# Patient Record
Sex: Male | Born: 1970 | Race: Black or African American | Hispanic: No | Marital: Married | Smoking: Former smoker
Health system: Southern US, Community
[De-identification: ages and names within clinical notes are randomized; demographics above are authoritative.]

## PROBLEM LIST (undated history)

## (undated) DIAGNOSIS — I10 Essential (primary) hypertension: Secondary | ICD-10-CM

## (undated) DIAGNOSIS — G71039 Limb girdle muscular dystrophy, unspecified: Secondary | ICD-10-CM

## (undated) DIAGNOSIS — M6282 Rhabdomyolysis: Secondary | ICD-10-CM

## (undated) DIAGNOSIS — Z87438 Personal history of other diseases of male genital organs: Secondary | ICD-10-CM

## (undated) DIAGNOSIS — G7109 Other specified muscular dystrophies: Secondary | ICD-10-CM

## (undated) HISTORY — DX: Limb girdle muscular dystrophy, unspecified: G71.039

## (undated) HISTORY — DX: Personal history of other diseases of male genital organs: Z87.438

## (undated) HISTORY — PX: MUSCLE BIOPSY: SHX716

## (undated) HISTORY — DX: Rhabdomyolysis: M62.82

## (undated) HISTORY — PX: HIP SURGERY: SHX245

## (undated) HISTORY — DX: Other specified muscular dystrophies: G71.09

---

## 2004-05-22 ENCOUNTER — Inpatient Hospital Stay (HOSPITAL_COMMUNITY): Admission: EM | Admit: 2004-05-22 | Discharge: 2004-05-23 | Payer: Self-pay | Admitting: *Deleted

## 2005-02-17 ENCOUNTER — Observation Stay (HOSPITAL_COMMUNITY): Admission: EM | Admit: 2005-02-17 | Discharge: 2005-02-19 | Payer: Self-pay | Admitting: Emergency Medicine

## 2005-08-21 ENCOUNTER — Encounter: Payer: Self-pay | Admitting: Internal Medicine

## 2006-02-16 ENCOUNTER — Ambulatory Visit: Payer: Self-pay | Admitting: Family Medicine

## 2006-03-07 ENCOUNTER — Encounter: Payer: Self-pay | Admitting: Internal Medicine

## 2006-06-03 ENCOUNTER — Emergency Department (HOSPITAL_COMMUNITY): Admission: EM | Admit: 2006-06-03 | Discharge: 2006-06-03 | Payer: Self-pay | Admitting: *Deleted

## 2006-08-22 ENCOUNTER — Encounter: Payer: Self-pay | Admitting: Internal Medicine

## 2006-11-26 ENCOUNTER — Encounter (INDEPENDENT_AMBULATORY_CARE_PROVIDER_SITE_OTHER): Payer: Self-pay | Admitting: Family Medicine

## 2007-02-22 ENCOUNTER — Ambulatory Visit: Payer: Self-pay | Admitting: Family Medicine

## 2007-02-22 DIAGNOSIS — G7109 Other specified muscular dystrophies: Secondary | ICD-10-CM

## 2007-02-25 ENCOUNTER — Telehealth (INDEPENDENT_AMBULATORY_CARE_PROVIDER_SITE_OTHER): Payer: Self-pay | Admitting: *Deleted

## 2007-02-25 ENCOUNTER — Encounter (INDEPENDENT_AMBULATORY_CARE_PROVIDER_SITE_OTHER): Payer: Self-pay | Admitting: *Deleted

## 2007-02-25 LAB — CONVERTED CEMR LAB
ALT: 25 units/L (ref 0–53)
AST: 24 units/L (ref 0–37)
Albumin: 3.8 g/dL (ref 3.5–5.2)
Alkaline Phosphatase: 69 units/L (ref 39–117)
BUN: 9 mg/dL (ref 6–23)
Bilirubin, Direct: 0.1 mg/dL (ref 0.0–0.3)
CO2: 29 meq/L (ref 19–32)
Calcium: 9.2 mg/dL (ref 8.4–10.5)
Chloride: 105 meq/L (ref 96–112)
Cholesterol: 169 mg/dL (ref 0–200)
Creatinine, Ser: 1.2 mg/dL (ref 0.4–1.5)
GFR calc Af Amer: 87 mL/min
GFR calc non Af Amer: 72 mL/min
Glucose, Bld: 89 mg/dL (ref 70–99)
HDL: 26.5 mg/dL — ABNORMAL LOW (ref >39.0)
LDL Cholesterol: 125 mg/dL — ABNORMAL HIGH (ref 0–99)
Magnesium: 1.9 mg/dL (ref 1.5–2.5)
Phosphorus: 3.9 mg/dL (ref 2.3–4.6)
Potassium: 3.7 meq/L (ref 3.5–5.1)
Sodium: 140 meq/L (ref 135–145)
TSH: 0.85 microintl units/mL (ref 0.35–5.50)
Total Bilirubin: 1.4 mg/dL — ABNORMAL HIGH (ref 0.3–1.2)
Total CHOL/HDL Ratio: 6.4
Total Protein: 6.8 g/dL (ref 6.0–8.3)
Triglycerides: 88 mg/dL (ref 0–149)
VLDL: 18 mg/dL (ref 0–40)

## 2007-04-29 ENCOUNTER — Telehealth (INDEPENDENT_AMBULATORY_CARE_PROVIDER_SITE_OTHER): Payer: Self-pay | Admitting: *Deleted

## 2007-05-17 ENCOUNTER — Encounter (INDEPENDENT_AMBULATORY_CARE_PROVIDER_SITE_OTHER): Payer: Self-pay | Admitting: Family Medicine

## 2007-05-24 ENCOUNTER — Ambulatory Visit: Payer: Self-pay | Admitting: Family Medicine

## 2007-05-24 DIAGNOSIS — R945 Abnormal results of liver function studies: Secondary | ICD-10-CM | POA: Insufficient documentation

## 2007-05-28 ENCOUNTER — Encounter (INDEPENDENT_AMBULATORY_CARE_PROVIDER_SITE_OTHER): Payer: Self-pay | Admitting: *Deleted

## 2007-05-28 LAB — CONVERTED CEMR LAB
Basophils Absolute: 0 10*3/uL (ref 0.0–0.1)
Basophils Relative: 0 % (ref 0–1)
Bilirubin, Direct: 0.1 mg/dL (ref 0.0–0.3)
Eosinophils Absolute: 0.1 10*3/uL (ref 0.0–0.7)
Eosinophils Relative: 2 % (ref 0–5)
HCT: 44 % (ref 39.0–52.0)
Hemoglobin: 14.3 g/dL (ref 13.0–17.0)
Lymphocytes Relative: 46 % (ref 12–46)
Lymphs Abs: 2.1 10*3/uL (ref 0.7–4.0)
MCHC: 32.5 g/dL (ref 30.0–36.0)
MCV: 90.5 fL (ref 78.0–100.0)
Monocytes Absolute: 0.3 10*3/uL (ref 0.1–1.0)
Monocytes Relative: 6 % (ref 3–12)
Neutro Abs: 2.2 10*3/uL (ref 1.7–7.7)
Neutrophils Relative %: 46 % (ref 43–77)
Platelets: 246 10*3/uL (ref 150–400)
RBC: 4.86 M/uL (ref 4.22–5.81)
RDW: 13.3 % (ref 11.5–15.5)
Total Bilirubin: 0.9 mg/dL (ref 0.3–1.2)
WBC: 4.7 10*3/uL (ref 4.0–10.5)

## 2007-10-09 ENCOUNTER — Ambulatory Visit: Payer: Self-pay | Admitting: Internal Medicine

## 2007-10-09 DIAGNOSIS — N500 Atrophy of testis: Secondary | ICD-10-CM

## 2007-10-09 DIAGNOSIS — B079 Viral wart, unspecified: Secondary | ICD-10-CM | POA: Insufficient documentation

## 2007-10-10 ENCOUNTER — Telehealth: Payer: Self-pay | Admitting: Internal Medicine

## 2007-10-15 ENCOUNTER — Ambulatory Visit: Payer: Self-pay | Admitting: Internal Medicine

## 2007-10-15 DIAGNOSIS — E785 Hyperlipidemia, unspecified: Secondary | ICD-10-CM

## 2007-10-15 DIAGNOSIS — L259 Unspecified contact dermatitis, unspecified cause: Secondary | ICD-10-CM | POA: Insufficient documentation

## 2008-07-27 ENCOUNTER — Encounter: Payer: Self-pay | Admitting: Internal Medicine

## 2008-08-03 ENCOUNTER — Ambulatory Visit: Payer: Self-pay | Admitting: Diagnostic Radiology

## 2008-08-03 ENCOUNTER — Ambulatory Visit (HOSPITAL_BASED_OUTPATIENT_CLINIC_OR_DEPARTMENT_OTHER): Admission: RE | Admit: 2008-08-03 | Discharge: 2008-08-03 | Payer: Self-pay | Admitting: Internal Medicine

## 2008-08-03 ENCOUNTER — Telehealth: Payer: Self-pay | Admitting: Internal Medicine

## 2008-08-03 ENCOUNTER — Ambulatory Visit: Payer: Self-pay | Admitting: Internal Medicine

## 2008-08-03 DIAGNOSIS — M6282 Rhabdomyolysis: Secondary | ICD-10-CM

## 2008-08-03 DIAGNOSIS — M25559 Pain in unspecified hip: Secondary | ICD-10-CM

## 2008-08-03 LAB — CONVERTED CEMR LAB
Hgb A2 Quant: 2.8 % (ref 2.2–3.2)
Hgb A: 97.2 % (ref 96.8–97.8)
Hgb F Quant: 0 % (ref 0.0–2.0)
Hgb S Quant: 0 % (ref 0.0–0.0)

## 2008-08-06 ENCOUNTER — Telehealth: Payer: Self-pay | Admitting: Internal Medicine

## 2008-08-25 ENCOUNTER — Telehealth: Payer: Self-pay | Admitting: Internal Medicine

## 2008-09-07 ENCOUNTER — Encounter: Payer: Self-pay | Admitting: Internal Medicine

## 2008-12-22 ENCOUNTER — Ambulatory Visit: Payer: Self-pay | Admitting: Internal Medicine

## 2008-12-22 DIAGNOSIS — R61 Generalized hyperhidrosis: Secondary | ICD-10-CM | POA: Insufficient documentation

## 2008-12-22 DIAGNOSIS — R51 Headache: Secondary | ICD-10-CM

## 2008-12-22 DIAGNOSIS — R519 Headache, unspecified: Secondary | ICD-10-CM | POA: Insufficient documentation

## 2008-12-22 LAB — CONVERTED CEMR LAB
ALT: 38 units/L (ref 0–53)
AST: 27 units/L (ref 0–37)
Albumin: 4.3 g/dL (ref 3.5–5.2)
Alkaline Phosphatase: 82 units/L (ref 39–117)
BUN: 12 mg/dL (ref 6–23)
Bilirubin, Direct: 0.2 mg/dL (ref 0.0–0.3)
CO2: 25 meq/L (ref 19–32)
Calcium: 9.5 mg/dL (ref 8.4–10.5)
Chloride: 106 meq/L (ref 96–112)
Creatinine, Ser: 1.21 mg/dL (ref 0.40–1.50)
Free T4: 0.81 ng/dL (ref 0.80–1.80)
Glucose, Bld: 93 mg/dL (ref 70–99)
Indirect Bilirubin: 1 mg/dL — ABNORMAL HIGH (ref 0.0–0.9)
Potassium: 4.3 meq/L (ref 3.5–5.3)
Sodium: 143 meq/L (ref 135–145)
TSH: 0.982 microintl units/mL (ref 0.350–4.500)
Total Bilirubin: 1.2 mg/dL (ref 0.3–1.2)
Total CK: 503 units/L — ABNORMAL HIGH (ref 7–232)
Total Protein: 7.1 g/dL (ref 6.0–8.3)

## 2008-12-23 ENCOUNTER — Encounter: Payer: Self-pay | Admitting: Internal Medicine

## 2009-01-26 ENCOUNTER — Ambulatory Visit (HOSPITAL_BASED_OUTPATIENT_CLINIC_OR_DEPARTMENT_OTHER): Admission: RE | Admit: 2009-01-26 | Discharge: 2009-01-26 | Payer: Self-pay | Admitting: Internal Medicine

## 2009-01-26 ENCOUNTER — Encounter: Payer: Self-pay | Admitting: Internal Medicine

## 2009-02-08 ENCOUNTER — Ambulatory Visit: Payer: Self-pay | Admitting: Pulmonary Disease

## 2009-09-16 ENCOUNTER — Ambulatory Visit: Payer: Self-pay | Admitting: Internal Medicine

## 2009-09-16 LAB — CONVERTED CEMR LAB
ALT: 43 units/L (ref 0–53)
AST: 24 units/L (ref 0–37)
Albumin: 4.2 g/dL (ref 3.5–5.2)
Alkaline Phosphatase: 90 units/L (ref 39–117)
BUN: 13 mg/dL (ref 6–23)
Bilirubin, Direct: 0.1 mg/dL (ref 0.0–0.3)
CO2: 24 meq/L (ref 19–32)
Calcium: 9.6 mg/dL (ref 8.4–10.5)
Chloride: 107 meq/L (ref 96–112)
Cholesterol: 187 mg/dL (ref 0–200)
Creatinine, Ser: 1.26 mg/dL (ref 0.40–1.50)
Glucose, Bld: 93 mg/dL (ref 70–99)
HDL: 36 mg/dL — ABNORMAL LOW (ref >39)
Indirect Bilirubin: 0.6 mg/dL (ref 0.0–0.9)
LDL Cholesterol: 130 mg/dL — ABNORMAL HIGH (ref 0–99)
Potassium: 4.6 meq/L (ref 3.5–5.3)
Sodium: 145 meq/L (ref 135–145)
TSH: 1.313 microintl units/mL (ref 0.350–4.500)
Total Bilirubin: 0.7 mg/dL (ref 0.3–1.2)
Total CHOL/HDL Ratio: 5.2
Total CK: 410 units/L — ABNORMAL HIGH (ref 7–232)
Total Protein: 7 g/dL (ref 6.0–8.3)
Triglycerides: 103 mg/dL (ref <150)
VLDL: 21 mg/dL (ref 0–40)

## 2009-09-17 ENCOUNTER — Encounter: Payer: Self-pay | Admitting: Internal Medicine

## 2010-04-17 LAB — CONVERTED CEMR LAB
ALT: 22 units/L (ref 0–53)
AST: 21 units/L (ref 0–37)
Albumin: 4.1 g/dL (ref 3.5–5.2)
Alkaline Phosphatase: 78 units/L (ref 39–117)
BUN: 13 mg/dL (ref 6–23)
Bacteria, UA: NEGATIVE
Bilirubin Urine: NEGATIVE
Bilirubin, Direct: 0.1 mg/dL (ref 0.0–0.3)
CO2: 30 meq/L (ref 19–32)
Calcium: 9.5 mg/dL (ref 8.4–10.5)
Chloride: 108 meq/L (ref 96–112)
Cholesterol: 178 mg/dL (ref 0–200)
Creatinine, Ser: 1.3 mg/dL (ref 0.4–1.5)
Crystals: NEGATIVE
GFR calc Af Amer: 79 mL/min
GFR calc non Af Amer: 65 mL/min
Glucose, Bld: 90 mg/dL (ref 70–99)
HCT: 41.6 % (ref 39.0–52.0)
HDL: 27.8 mg/dL — ABNORMAL LOW (ref >39.0)
Hemoglobin, Urine: NEGATIVE
Hemoglobin: 14.4 g/dL (ref 13.0–17.0)
Ketones, ur: NEGATIVE mg/dL
LDL Cholesterol: 131 mg/dL — ABNORMAL HIGH (ref 0–99)
Leukocytes, UA: NEGATIVE
MCHC: 34.5 g/dL (ref 30.0–36.0)
MCV: 91.6 fL (ref 78.0–100.0)
Nitrite: NEGATIVE
PSA: 0.3 ng/mL (ref 0.10–4.00)
Platelets: 248 10*3/uL (ref 150–400)
Potassium: 4.9 meq/L (ref 3.5–5.1)
RBC / HPF: NONE SEEN
RBC: 4.54 M/uL (ref 4.22–5.81)
RDW: 12.5 % (ref 11.5–14.6)
Sodium: 144 meq/L (ref 135–145)
Specific Gravity, Urine: 1.025 (ref 1.000–1.03)
Squamous Epithelial / HPF: NEGATIVE /lpf
TSH: 0.9 microintl units/mL (ref 0.35–5.50)
Total Bilirubin: 1.2 mg/dL (ref 0.3–1.2)
Total CHOL/HDL Ratio: 6.4
Total Protein, Urine: NEGATIVE mg/dL
Total Protein: 7.4 g/dL (ref 6.0–8.3)
Triglycerides: 96 mg/dL (ref 0–149)
Urine Glucose: NEGATIVE mg/dL
Urobilinogen, UA: 0.2 (ref 0.0–1.0)
VLDL: 19 mg/dL (ref 0–40)
WBC, UA: NONE SEEN cells/hpf
WBC: 4.9 10*3/uL (ref 4.5–10.5)
pH: 6 (ref 5.0–8.0)
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
## 2010-04-19 NOTE — Letter (Signed)
   [place] [facility] [address] [place], [place]  [address]  [place] Phone: [phone]      September 17, 2009   MONTRAIL MEHRER [address] [place], [place] [address]  RE:  LAB RESULTS  Dear  Randy James,  The following is an interpretation of your most recent lab tests.  Please take note of any instructions provided or changes to medications that have resulted from your lab work.  ELECTROLYTES:  Good - no changes needed  KIDNEY FUNCTION TESTS:  Good - no changes needed  LIVER FUNCTION TESTS:  Good - no changes needed  LIPID PANEL:  Fair - review at your next visit Triglyceride: 103   Cholesterol: 187   LDL: 130   HDL: 36   Chol/HDL%:  5.2 Ratio  THYROID STUDIES:  Thyroid studies normal TSH: 1.313     CPK - 410 ( mild elevation )       Sincerely Yours,    Dr. Thomos Lemons

## 2010-08-05 NOTE — H&P (Signed)
NAMEBOHDI, LEEDS             ACCOUNT NO.:  [id number]   MEDICAL RECORD NO.:  [id number]          PATIENT TYPE:  INP   LOCATION:  3735                         FACILITY:  [facility]   PHYSICIAN:  Kela Millin, M.D.DATE OF BIRTH:  September 30, 1970   DATE OF ADMISSION:  02/17/2005  DATE OF DISCHARGE:                                HISTORY & PHYSICAL   PRIMARY CARE PHYSICIAN:  Leanne Chang, M.D.   CHIEF COMPLAINT:  Chest tightness.   HISTORY OF PRESENT ILLNESS:  The patient is a 40 year old black male with  past medical history significant for rhabdomyolysis, February 2006, who  presents with complaints of chest tightness for one day.  He states that it  was constant and 7/10 in intensity and substernal in location.  He  admits  to __________  shortness of breath and denies nausea and vomiting.  Also  denies radiation.  The patient denies cough, orthopnea, and PND and also no  leg swelling.  The patient was seen in the ER and a point-of-care markers  done revealed CK of 12,108, with troponins of less than 0.05.  The patient  denies myalgias, hematuria. Also denies dysuria and no fevers.  He states  that after hospitalization in February 2006 for rhabdo, he did not go back  to exercising until about six days ago when he went and did light weight  lifting and played basketball as well.  He denies cocaine use.  Also denies  amphetamine abuse.  In the ER he also had a urine drug screen done which was  positive for amphetamines and he states that he took some Dayquil.  He is  admitted to the [facility] for evaluation and management.   PAST MEDICAL HISTORY:  1.  As above.  2.  History of epididymidis/abscess.   MEDICATIONS:  Dayquil p.r.n.   ALLERGIES:  No known drug allergies.   SOCIAL HISTORY:  He denies tobacco.  Also denies alcohol.  Denies illicit  drug use.   REVIEW OF SYSTEMS:  Per HPI.  Other complaints on review of systems  negative.   PHYSICAL EXAMINATION:   GENERAL:  The patient is a young, black male in no  apparent distress.  VITAL SIGNS:  Temperature 98, blood pressure 135/80, pulse 67, respiratory  rate 18, oxygen saturation 97%.  HEENT:  PERRL.  Sclerae anicteric.  EOMI.  No oral exudates.  LUNGS:  Clear to auscultation bilaterally.  No crackles or wheezes.  CARDIOVASCULAR:  Regular rate and rhythm.  Normal S1 and S2.  ABDOMEN:  Soft.  Bowel sounds present.  Nontender.  Nondistended.  No  organomegaly and no masses palpable.  EXTREMITIES:  No cyanosis or edema.  NEUROLOGIC:  Alert and oriented x3.  Cranial nerves II-XII grossly intact.  Nonfocal exam.   LABORATORY DATA:  Urinalysis shows specific gravity of 1.030.  Urine PH of  6.5.  Urine glucose negative.  Urine bilirubin negative.  Urine ketones  negative.  Leukocyte esterase negative.  White cell count is 4.8, hemoglobin  13.2, hematocrit 38.4, platelet count 221, neutrophil count 55%.  Sodium  141, potassium 3.9, chloride 106,  CO2 31, glucose 89, BUN 7, creatinine 1.2,  calcium 8.8, myoglobin greater than 500.  CK-MB 2.6.  Troponin-I less than  0.05.  His total CK is 12,108.  Urine drug screen positive for amphetamines.   ASSESSMENT/PLAN:  Problem #1.  Abnormal CK/rhabdomyolysis.  As discussed  above, the patient is a 40 year old with previous history of rhabdomyolysis  presenting with chest tightness and CKs found to be markedly elevated as  above with normal troponins.  Urine is positive for amphetamines although he  denies amphetamine abuse and he admits to weight lifting and playing  basketball about six days ago but states he did not overexert himself.  Will  start IV fluids, saline diuresis, monitor kidney function and recheck CK.  Follow and consider urine colonization as appropriate pending repeat labs.  Will also check a serum phosphate level, follow and consider further  evaluation as appropriate.           ______________________________  Kela Millin,  M.D.     ACV/MEDQ  D:  02/16/2005  T:  02/17/2005  Job:  [id number]   cc:   Leanne Chang, M.D.  Fax: [phone]

## 2010-08-05 NOTE — H&P (Signed)
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  On Wednesday, he did a very vigorous weight  lifting work out including three sets of lifts on his back with the head up,  three sets with the head down, and then two additional sets.  After  completing this work out, his chest, shoulder muscles, and biceps muscles  were extremely sore.  The area around the elbows were somewhat swollen.  The  next day, he noted on two occasions that his urine was dark.  Today, he  continued to note the dark urine and pointed this out to his wife who urged  him to go to urgent care.  He went to the [facility] where his  blood pressure was noted to be 110/80.  Apparently a urinalysis was done in  the [facility] which showed the presence of substantial blood and  protein as well as granular casts, for this reason he was urged to come to  the emergency room with the presumptive diagnosis of rhabdomyolysis.  Since  his arrival in the emergency room, he has been found to have a sodium of  141, potassium of 4.2, a BUN of 14, creatinine of 1.4.  His serum  bicarbonate is 28.2.  CPK is known to be elevated although the actual result  is not known at this point as it has to be diluted.  The patient is admitted  at this time for further treatment of rhabdomyolysis.   PAST MEDICAL HISTORY:   MEDICATIONS:  None.   ALLERGIES:  None.   MEDICAL ILLNESSES:  1.  In 1998, the patient was treated for epididymidis and actually had  to      have a abscess incised and drained at that time.  2.  He has had a previous tonsillar abscess which was drained.  He does not recall any other significant illnesses.   OPERATIONS:  Otherwise none.   FAMILY HISTORY:  He has a brother with diabetes.  His mother has mild  hypertension.  His father also has diabetes.   SOCIAL HISTORY:  The patient indicates he does not smoke.  He does not abuse  alcohol or drugs.   REVIEW OF SYSTEMS:  HEAD:  He denies headache or dizziness.  EYES:  He  denies visual blurring or diplopia.  EARS/NOSE/THROAT:  He denies earache,  sinus pain, or sore throat.  CHEST:  Denies coughing, wheezing, or chest  congestion.  CARDIOVASCULAR:  Denies orthopnea, PND, or ankle edema.  GI:  Denies nausea, vomiting, abdominal pain, change in bowel  habits, melena, or  hematochezia.  GU:  See above.  There has been no dysuria.  No urinary  frequency.  There is no previous history of urinary tract infection, except  as noted above.  NEURO:  There is no history of seizure or stroke.  ENDO:  There is no history of excessive thirst, urinary frequency, nocturia, heat  or cold intolerance.   PHYSICAL EXAMINATION:  VITAL SIGNS:  His blood pressure was 137/85, pulse  68, respirations 20, O2 saturation was 97%.  HEENT:  Within normal limits.  CHEST:  Clear.  BACK:  Revealed no CVA or point tenderness.  CARDIOVASCULAR:  Revealed normal S1 and S2.  There were no rubs, murmurs, or  gallops.  ABDOMEN:  Benign.  There are normal bowel sounds.  There are no masses or  tenderness.  No guarding or rebound.  NEUROLOGIC:  Testing.  EXTREMITIES:  Normal except that the muscle tissue around the elbows was  somewhat tender to palpation and the chest wall was somewhat tender to  palpation as well.   IMPRESSION:  1.  Rhabdomyolysis.  2.  History of epididymidis.   PLAN:  1.  The patient will be admitted for intravenous fluid therapy, to try to      achieve significant diuresis.   2.  We will administer normal saline x 2 liters and then add bicarbonate to      the IV fluids to attempt to alkalinize the urine.  Please see orders.  3.  The renal function will be followed closely as will the serum CPK.  4.  A renal ultrasound will be obtained to assess kidney morphology.      SY/MEDQ  D:  05/21/2004  T:  05/22/2004  Job:  [id number]

## 2010-08-05 NOTE — Assessment & Plan Note (Signed)
[facility]                                 ON-CALL NOTE   PHENG, PROKOP                      MRN:          [id number]  DATE:06/03/2006                            DOB:          18-Nov-1970    Mr. 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 He has a history of muscular dystrophy  precipitated significantly with physical activity. The patient reports  that he had worked in the yard yesterday and today woke up with pain in  his joints with swelling, especially in his elbows.   PLAN:  I advised the patient that he should seek medical attention  immediately either in the emergency department or at an urgent care. I  also advised the patient that he needs to establish physically in our  office in the near future. The patient expressed understanding.     Leanne Chang, M.D.  Electronically Signed    LA/MedQ  DD: 06/03/2006  DT: 06/04/2006  Job #: [id number]

## 2010-10-18 ENCOUNTER — Encounter: Payer: Self-pay | Admitting: Internal Medicine

## 2010-10-21 ENCOUNTER — Encounter: Payer: Self-pay | Admitting: Internal Medicine

## 2010-10-24 ENCOUNTER — Ambulatory Visit (INDEPENDENT_AMBULATORY_CARE_PROVIDER_SITE_OTHER): Payer: BC Managed Care – PPO | Admitting: Internal Medicine

## 2010-10-24 ENCOUNTER — Encounter: Payer: Self-pay | Admitting: Internal Medicine

## 2010-10-24 VITALS — BP 126/90 | HR 66 | Temp 98.0°F | Resp 18 | Ht 69.5 in | Wt 248.0 lb

## 2010-10-24 DIAGNOSIS — Z0000 Encounter for general adult medical examination without abnormal findings: Secondary | ICD-10-CM | POA: Insufficient documentation

## 2010-10-24 LAB — BASIC METABOLIC PANEL
BUN: 11 mg/dL (ref 6–23)
CO2: 29 mEq/L (ref 19–32)
Glucose, Bld: 87 mg/dL (ref 70–99)
Potassium: 4.1 mEq/L (ref 3.5–5.3)
Sodium: 142 mEq/L (ref 135–145)

## 2010-10-24 LAB — LIPID PANEL
Cholesterol: 159 mg/dL (ref 0–200)
HDL: 28 mg/dL — ABNORMAL LOW (ref >39)
Total CHOL/HDL Ratio: 5.7 Ratio
Triglycerides: 168 mg/dL — ABNORMAL HIGH (ref <150)
VLDL: 34 mg/dL (ref 0–40)

## 2010-10-24 LAB — CBC WITH DIFFERENTIAL/PLATELET
Basophils Absolute: 0 10*3/uL (ref 0.0–0.1)
Basophils Relative: 0 % (ref 0–1)
Eosinophils Absolute: 0.2 10*3/uL (ref 0.0–0.7)
Eosinophils Relative: 3 % (ref 0–5)
HCT: 40.9 % (ref 39.0–52.0)
Hemoglobin: 13.9 g/dL (ref 13.0–17.0)
Lymphs Abs: 2.4 10*3/uL (ref 0.7–4.0)
MCH: 30.5 pg (ref 26.0–34.0)
MCHC: 34 g/dL (ref 30.0–36.0)
MCV: 89.7 fL (ref 78.0–100.0)
Monocytes Relative: 8 % (ref 3–12)
Neutro Abs: 2.4 10*3/uL (ref 1.7–7.7)
Neutrophils Relative %: 45 % (ref 43–77)
Platelets: 243 10*3/uL (ref 150–400)
RBC: 4.56 MIL/uL (ref 4.22–5.81)
RDW: 13.1 % (ref 11.5–15.5)
WBC: 5.4 10*3/uL (ref 4.0–10.5)

## 2010-10-24 LAB — T4, FREE: Free T4: 0.87 ng/dL (ref 0.80–1.80)

## 2010-10-24 LAB — HEPATIC FUNCTION PANEL
ALT: 20 U/L (ref 0–53)
AST: 21 U/L (ref 0–37)
Alkaline Phosphatase: 88 U/L (ref 39–117)
Bilirubin, Direct: 0.1 mg/dL (ref 0.0–0.3)
Indirect Bilirubin: 0.5 mg/dL (ref 0.0–0.9)
Total Bilirubin: 0.6 mg/dL (ref 0.3–1.2)
Total Protein: 6.9 g/dL (ref 6.0–8.3)

## 2010-10-24 LAB — VITAMIN B12: Vitamin B-12: 269 pg/mL (ref 211–911)

## 2010-10-24 LAB — TSH: TSH: 1.098 u[IU]/mL (ref 0.350–4.500)

## 2010-10-24 MED ORDER — FLUTICASONE PROPIONATE 50 MCG/ACT NA SUSP: 1.0000 | Freq: Every day | NASAL | Status: DC

## 2010-10-24 NOTE — Assessment & Plan Note (Signed)
Nl exam. Obtain cbc, chem7, lipid, lft, tsh, ua, vit d, b12 and testosterone. Attempt flonase due to hoarseness and drainage/congestion. followup if no improvement or worsening

## 2010-10-24 NOTE — Progress Notes (Signed)
  Subjective:    Patient ID: Randy James, male    DOB: Oct 29, 1970, 40 y.o.   MRN: [id number]  HPI Pt presents to clinic for annual physical. H/o of muscular dystrophy followed at [facility]. Notes mild hoarseness since 7/5. No recent cough or uri sx's. bp minimally elevated. Complaints of generalized fatigue. No other complaints.  Reviewed pmh, medications and allergies.    Review of Systems see hpi     Objective:   Physical Exam  Nursing note and vitals reviewed. Constitutional: He appears well-developed and well-nourished. No distress.  HENT:  Head: Normocephalic and atraumatic.  Right Ear: Tympanic membrane, external ear and ear canal normal.  Left Ear: Tympanic membrane, external ear and ear canal normal.  Nose: Nose normal.  Mouth/Throat: Oropharynx is clear and moist. No oropharyngeal exudate.       Posterior op clear drainage.  Eyes: EOM are normal. Pupils are equal, round, and reactive to light. Right eye exhibits no discharge. Left eye exhibits no discharge. No scleral icterus.  Neck: Neck supple. No JVD present. Carotid bruit is not present. No thyromegaly present.  Cardiovascular: Normal rate, regular rhythm, normal heart sounds and intact distal pulses.  Exam reveals no gallop and no friction rub.   No murmur heard. Pulmonary/Chest: Effort normal and breath sounds normal. No respiratory distress. He has no wheezes. He has no rales.  Abdominal: Soft. Bowel sounds are normal. He exhibits no distension and no mass. There is no hepatosplenomegaly. There is no tenderness. There is no rebound and no guarding. Hernia confirmed negative in the right inguinal area and confirmed negative in the left inguinal area.  Genitourinary: Testes normal. Right testis shows no mass and no tenderness. Right testis is descended. Left testis shows no mass and no tenderness. Left testis is descended.  Lymphadenopathy:    He has no cervical adenopathy.       Right: No inguinal adenopathy present.         Left: No inguinal adenopathy present.  Neurological: He is alert.  Skin: Skin is warm and dry. No rash noted. He is not diaphoretic. No erythema.  Psychiatric: He has a normal mood and affect.          Assessment & Plan:

## 2010-10-24 NOTE — Patient Instructions (Signed)
Please schedule morning lab testosterone Friday 10/28/10 (YN:[id number])

## 2010-10-25 LAB — URINALYSIS
Bilirubin Urine: NEGATIVE
Glucose, UA: NEGATIVE mg/dL
Hgb urine dipstick: NEGATIVE
Ketones, ur: NEGATIVE mg/dL
Leukocytes, UA: NEGATIVE
Nitrite: NEGATIVE
Specific Gravity, Urine: 1.026 (ref 1.005–1.030)
pH: 5.5 (ref 5.0–8.0)

## 2010-10-25 LAB — VITAMIN D 25 HYDROXY (VIT D DEFICIENCY, FRACTURES): Vit D, 25-Hydroxy: 14 ng/mL — ABNORMAL LOW (ref 30–89)

## 2010-11-02 ENCOUNTER — Other Ambulatory Visit: Payer: Self-pay | Admitting: Internal Medicine

## 2010-11-02 DIAGNOSIS — E569 Vitamin deficiency, unspecified: Secondary | ICD-10-CM

## 2010-11-02 MED ORDER — NIACIN 500 MG PO TABS: 500.0000 mg | ORAL_TABLET | Freq: Every day | ORAL | Status: AC

## 2010-11-02 MED ORDER — VITAMIN D2 50 MCG (2000 UT) PO TABS: 2000.0000 [IU] | ORAL_TABLET | Freq: Every day | ORAL | Status: DC

## 2010-11-02 MED ORDER — VITAMIN B-12 1000 MCG PO TABS: 1000.0000 ug | ORAL_TABLET | Freq: Every day | ORAL | Status: AC

## 2010-11-04 ENCOUNTER — Other Ambulatory Visit: Payer: Self-pay | Admitting: Internal Medicine

## 2010-11-04 DIAGNOSIS — R5383 Other fatigue: Secondary | ICD-10-CM

## 2010-11-04 DIAGNOSIS — Z0000 Encounter for general adult medical examination without abnormal findings: Secondary | ICD-10-CM

## 2010-11-05 LAB — TESTOSTERONE: Testosterone: 123.6 ng/dL — ABNORMAL LOW (ref 250–890)

## 2010-11-05 LAB — VITAMIN D 25 HYDROXY (VIT D DEFICIENCY, FRACTURES): Vit D, 25-Hydroxy: 17 ng/mL — ABNORMAL LOW (ref 30–89)

## 2010-11-07 ENCOUNTER — Other Ambulatory Visit: Payer: Self-pay | Admitting: Internal Medicine

## 2010-11-07 DIAGNOSIS — E291 Testicular hypofunction: Secondary | ICD-10-CM

## 2010-11-29 ENCOUNTER — Ambulatory Visit: Payer: BC Managed Care – PPO | Admitting: Internal Medicine

## 2010-11-30 ENCOUNTER — Encounter: Payer: Self-pay | Admitting: Internal Medicine

## 2010-11-30 ENCOUNTER — Ambulatory Visit: Payer: BC Managed Care – PPO | Admitting: Internal Medicine

## 2010-11-30 ENCOUNTER — Telehealth: Payer: Self-pay | Admitting: Internal Medicine

## 2010-11-30 ENCOUNTER — Ambulatory Visit (INDEPENDENT_AMBULATORY_CARE_PROVIDER_SITE_OTHER): Payer: BC Managed Care – PPO | Admitting: Internal Medicine

## 2010-11-30 VITALS — BP 138/88 | HR 79 | Temp 97.9°F | Resp 18 | Ht 69.5 in | Wt 246.0 lb

## 2010-11-30 DIAGNOSIS — R799 Abnormal finding of blood chemistry, unspecified: Secondary | ICD-10-CM

## 2010-11-30 DIAGNOSIS — E291 Testicular hypofunction: Secondary | ICD-10-CM

## 2010-11-30 DIAGNOSIS — E559 Vitamin D deficiency, unspecified: Secondary | ICD-10-CM | POA: Insufficient documentation

## 2010-11-30 DIAGNOSIS — R030 Elevated blood-pressure reading, without diagnosis of hypertension: Secondary | ICD-10-CM

## 2010-11-30 DIAGNOSIS — R490 Dysphonia: Secondary | ICD-10-CM

## 2010-11-30 LAB — PROLACTIN: Prolactin: 8.1 ng/mL (ref 2.1–17.1)

## 2010-11-30 LAB — LUTEINIZING HORMONE: LH: 4.8 m[IU]/mL (ref 1.5–9.3)

## 2010-11-30 LAB — FOLLICLE STIMULATING HORMONE: FSH: 6 m[IU]/mL (ref 1.4–18.1)

## 2010-11-30 NOTE — Assessment & Plan Note (Signed)
Failing flonase. Proceed with ENT consult for consideration of direct vocal cord visualization.

## 2010-11-30 NOTE — Patient Instructions (Signed)
Please schedule vitamin d level (vit d deficiency) and b12 level (abnormal chemistry) prior to next visit

## 2010-11-30 NOTE — Telephone Encounter (Signed)
Patient scheduled for follow up with [facility] on 11-15 please fax order for lab work week prior

## 2010-11-30 NOTE — Assessment & Plan Note (Signed)
Obtain free testosterone, fsh, lh and prolactin given young age. If fsh, lh and prolactin nl then likely secondary to testicular atrophy.

## 2010-11-30 NOTE — Progress Notes (Signed)
  Subjective:    Patient ID: Randy James, male    DOB: Feb 09, 1971, 40 y.o.   MRN: [id number]  HPI Pt presents to clinic for followup of multiple medical problems. Notes several month h/o throat discomfort and hoarseness. Has attempted flonase daily without any improvement. Reviewed depressed total testosterone level in the setting of fatigue. 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 BP reviewed borderline for the second visit. No personal h/o htn but has definite fam hx. Now taking otc vit d for depressed vit d level. b12 was low nl. No other complaints.  Past Medical History  Diagnosis Date  . Limb-girdle muscular dystrophy   . Rhabdomyolysis     history of with transient renal insufficency  . History of epididymitis    Past Surgical History  Procedure Date  . Muscle biopsy     EMG, Gentic Testing    reports that he has quit smoking. He does not have any smokeless tobacco history on file. He reports that he drinks alcohol. He reports that he does not use illicit drugs. family history includes Cancer in his maternal grandmother; Diabetes in his father; Diabetes type II in his brother; Hyperlipidemia in an unspecified family member; Hypertension in his mother; Rheum arthritis in an unspecified family member; and Sickle cell anemia in his cousin. No Known Allergies   Review of Systems see hpi     Objective:   Physical Exam  Nursing note and vitals reviewed. Constitutional: He appears well-developed and well-nourished. No distress.  HENT:  Head: Normocephalic and atraumatic.  Neurological: He is alert.  Skin: He is not diaphoretic.  Psychiatric: He has a normal mood and affect.          Assessment & Plan:

## 2010-11-30 NOTE — Assessment & Plan Note (Signed)
Continue vit d supplementation. Obtain vit d level prior to next visit

## 2010-11-30 NOTE — Assessment & Plan Note (Signed)
Recommend low sodium diet, regular cardio exercise and monitoring of bp. F/u in two months or sooner if needed.

## 2010-11-30 NOTE — Telephone Encounter (Signed)
Lab order entered for the first week in November

## 2010-12-01 LAB — TESTOSTERONE, FREE, TOTAL, SHBG
Sex Hormone Binding: 9 nmol/L — ABNORMAL LOW (ref 13–71)
Testosterone, Free: 59.4 pg/mL (ref 47.0–244.0)
Testosterone-% Free: 3.2 % — ABNORMAL HIGH (ref 1.6–2.9)
Testosterone: 183.72 ng/dL — ABNORMAL LOW (ref 250–890)

## 2010-12-09 ENCOUNTER — Telehealth: Payer: Self-pay | Admitting: *Deleted

## 2010-12-09 NOTE — Telephone Encounter (Signed)
BP Readings from Last 3 Encounters:  11/30/10 138/88  10/24/10 126/90  09/16/09 124/80   Blood pressures here in the office have been much lower than his readings at home.  I recommend that he make an apt to be seen in the office by Dr Rodena Medin as soon as he returns.  Otherwise, he should continue to monitor his blood pressure.  If >180 then he should be seen an an urgent care while he is out of town.

## 2010-12-09 NOTE — Telephone Encounter (Signed)
Received call from pt stating he was told to monitor his BP at his last visit. Reports that his BP the last 3 days has been 141/89, 160/102 and 176/99. Pt states he is leaving to go out of state in 1 hour and will be returning on Sunday. Pt denies chest pain but reports fatigue and diaphoresis when his BP runs up.  Pt wants to know what to do about his elevated BP?

## 2010-12-09 NOTE — Telephone Encounter (Signed)
Notified pt.  Appointment scheduled with Dr Rodena Medin for 12/12/10 at 9:45.

## 2010-12-12 ENCOUNTER — Ambulatory Visit (INDEPENDENT_AMBULATORY_CARE_PROVIDER_SITE_OTHER): Payer: BC Managed Care – PPO | Admitting: Internal Medicine

## 2010-12-12 ENCOUNTER — Encounter: Payer: Self-pay | Admitting: Internal Medicine

## 2010-12-12 ENCOUNTER — Telehealth: Payer: Self-pay | Admitting: Internal Medicine

## 2010-12-12 VITALS — BP 136/84 | HR 69 | Temp 97.9°F | Ht 69.5 in | Wt 247.0 lb

## 2010-12-12 DIAGNOSIS — R490 Dysphonia: Secondary | ICD-10-CM

## 2010-12-12 DIAGNOSIS — R030 Elevated blood-pressure reading, without diagnosis of hypertension: Secondary | ICD-10-CM

## 2010-12-12 DIAGNOSIS — E291 Testicular hypofunction: Secondary | ICD-10-CM

## 2010-12-12 NOTE — Patient Instructions (Signed)
Please schedule total testosterone, free testosterone (hypogonadism) in 2months

## 2010-12-13 ENCOUNTER — Other Ambulatory Visit: Payer: Self-pay | Admitting: Internal Medicine

## 2010-12-13 DIAGNOSIS — E291 Testicular hypofunction: Secondary | ICD-10-CM

## 2010-12-13 NOTE — Telephone Encounter (Signed)
Lab orders already in place for [facility].

## 2010-12-18 NOTE — Assessment & Plan Note (Signed)
ENT consult

## 2010-12-18 NOTE — Assessment & Plan Note (Signed)
Repeat lab evaluation in 2 months.

## 2010-12-18 NOTE — Progress Notes (Signed)
  Subjective:    Patient ID: Randy James, male    DOB: 29-May-1970, 40 y.o.   MRN: [id number]  HPI Pt presents to clinic for followup of multiple medical problems. Continues to suffer from chronic hoarseness and agrees with ent consult. Requests to see Dr. Christell Constant. Notes home bp's elevate (141/89, 160/102, 172/99). Clinic bps 138/88, 126/90 and 124/80. Wrist cuff used at home compared and is reading high. Reviewed hypogonadism workup with low total testosterone x2 with nl free and low sbp. Fsh, prolactin and lh nl. No other complaints.  Past Medical History  Diagnosis Date  . Limb-girdle muscular dystrophy   . Rhabdomyolysis     history of with transient renal insufficency  . History of epididymitis    Past Surgical History  Procedure Date  . Muscle biopsy     EMG, Gentic Testing    reports that he has quit smoking. He has never used smokeless tobacco. He reports that he drinks alcohol. He reports that he does not use illicit drugs. family history includes Cancer in his maternal grandmother; Diabetes in his father; Diabetes type II in his brother; Hyperlipidemia in an unspecified family member; Hypertension in his mother; Rheum arthritis in an unspecified family member; and Sickle cell anemia in his cousin. No Known Allergies   Review of Systems see hpi     Objective:   Physical Exam  Nursing note and vitals reviewed. Constitutional: He appears well-developed and well-nourished. No distress.  Neurological: He is alert.  Skin: Skin is warm and dry. He is not diaphoretic.  Psychiatric: He has a normal mood and affect.          Assessment & Plan:

## 2010-12-18 NOTE — Assessment & Plan Note (Signed)
Inaccurate home bp cuff readings. Alternative readings. Borderline htn. Low sodium diet and exercise. Will follow

## 2011-02-02 ENCOUNTER — Ambulatory Visit: Payer: BC Managed Care – PPO | Admitting: Internal Medicine

## 2011-03-01 ENCOUNTER — Ambulatory Visit: Payer: BC Managed Care – PPO | Admitting: Internal Medicine

## 2011-04-24 ENCOUNTER — Ambulatory Visit: Payer: BC Managed Care – PPO | Admitting: Internal Medicine

## 2011-05-02 ENCOUNTER — Ambulatory Visit: Payer: BC Managed Care – PPO | Admitting: Internal Medicine

## 2016-06-01 ENCOUNTER — Emergency Department (HOSPITAL_BASED_OUTPATIENT_CLINIC_OR_DEPARTMENT_OTHER)
Admission: EM | Admit: 2016-06-01 | Discharge: 2016-06-01 | Disposition: A | Discharge status: Home / Self Care | Payer: No Typology Code available for payment source | Attending: Emergency Medicine | Admitting: Emergency Medicine

## 2016-06-01 ENCOUNTER — Encounter (HOSPITAL_BASED_OUTPATIENT_CLINIC_OR_DEPARTMENT_OTHER): Payer: Self-pay

## 2016-06-01 ENCOUNTER — Emergency Department (HOSPITAL_BASED_OUTPATIENT_CLINIC_OR_DEPARTMENT_OTHER): Payer: No Typology Code available for payment source

## 2016-06-01 DIAGNOSIS — S4992XA Unspecified injury of left shoulder and upper arm, initial encounter: Secondary | ICD-10-CM | POA: Diagnosis present

## 2016-06-01 DIAGNOSIS — Y92410 Unspecified street and highway as the place of occurrence of the external cause: Secondary | ICD-10-CM | POA: Insufficient documentation

## 2016-06-01 DIAGNOSIS — I10 Essential (primary) hypertension: Secondary | ICD-10-CM | POA: Diagnosis not present

## 2016-06-01 DIAGNOSIS — Y9389 Activity, other specified: Secondary | ICD-10-CM | POA: Insufficient documentation

## 2016-06-01 DIAGNOSIS — Z87891 Personal history of nicotine dependence: Secondary | ICD-10-CM | POA: Insufficient documentation

## 2016-06-01 DIAGNOSIS — Z79899 Other long term (current) drug therapy: Secondary | ICD-10-CM | POA: Diagnosis not present

## 2016-06-01 DIAGNOSIS — M25512 Pain in left shoulder: Secondary | ICD-10-CM | POA: Insufficient documentation

## 2016-06-01 DIAGNOSIS — Y999 Unspecified external cause status: Secondary | ICD-10-CM | POA: Insufficient documentation

## 2016-06-01 HISTORY — DX: Essential (primary) hypertension: I10

## 2016-06-01 MED ORDER — IBUPROFEN 600 MG PO TABS: 600.0000 mg | ORAL_TABLET | Freq: Four times a day (QID) | ORAL | 0 refills | Status: AC | PRN

## 2016-06-01 MED ORDER — METHOCARBAMOL 500 MG PO TABS: 500.0000 mg | ORAL_TABLET | Freq: Two times a day (BID) | ORAL | 0 refills | Status: AC

## 2016-06-01 MED ORDER — IBUPROFEN 800 MG PO TABS
800.0000 mg | ORAL_TABLET | Freq: Once | ORAL | Status: AC
  Administered 2016-06-01: 800 mg via ORAL
  Filled 2016-06-01: qty 1

## 2016-06-01 MED ORDER — METHOCARBAMOL 500 MG PO TABS
1000.0000 mg | ORAL_TABLET | Freq: Once | ORAL | Status: AC
  Administered 2016-06-01: 1000 mg via ORAL
  Filled 2016-06-01: qty 2
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
## 2018-03-23 ENCOUNTER — Emergency Department (HOSPITAL_BASED_OUTPATIENT_CLINIC_OR_DEPARTMENT_OTHER): Payer: BC Managed Care – PPO

## 2018-03-23 ENCOUNTER — Emergency Department (HOSPITAL_BASED_OUTPATIENT_CLINIC_OR_DEPARTMENT_OTHER)
Admission: EM | Admit: 2018-03-23 | Discharge: 2018-03-23 | Disposition: A | Discharge status: Home / Self Care | Payer: BC Managed Care – PPO | Attending: Emergency Medicine | Admitting: Emergency Medicine

## 2018-03-23 ENCOUNTER — Encounter (HOSPITAL_BASED_OUTPATIENT_CLINIC_OR_DEPARTMENT_OTHER): Payer: Self-pay

## 2018-03-23 ENCOUNTER — Other Ambulatory Visit: Payer: Self-pay

## 2018-03-23 DIAGNOSIS — Z87891 Personal history of nicotine dependence: Secondary | ICD-10-CM | POA: Diagnosis not present

## 2018-03-23 DIAGNOSIS — Z79899 Other long term (current) drug therapy: Secondary | ICD-10-CM | POA: Diagnosis not present

## 2018-03-23 DIAGNOSIS — I10 Essential (primary) hypertension: Secondary | ICD-10-CM | POA: Diagnosis not present

## 2018-03-23 DIAGNOSIS — R072 Precordial pain: Secondary | ICD-10-CM | POA: Diagnosis not present

## 2018-03-23 DIAGNOSIS — R200 Anesthesia of skin: Secondary | ICD-10-CM | POA: Insufficient documentation

## 2018-03-23 LAB — DIFFERENTIAL
Abs Immature Granulocytes: 0.02 10*3/uL (ref 0.00–0.07)
BASOS ABS: 0 10*3/uL (ref 0.0–0.1)
Basophils Relative: 0 %
Eosinophils Absolute: 0.2 10*3/uL (ref 0.0–0.5)
Eosinophils Relative: 3 %
Immature Granulocytes: 0 %
Lymphocytes Relative: 40 %
Lymphs Abs: 2.7 10*3/uL (ref 0.7–4.0)
MONOS PCT: 8 %
Monocytes Absolute: 0.6 10*3/uL (ref 0.1–1.0)
Neutro Abs: 3.3 10*3/uL (ref 1.7–7.7)
Neutrophils Relative %: 49 %

## 2018-03-23 LAB — COMPREHENSIVE METABOLIC PANEL
ALT: 38 U/L (ref 0–44)
AST: 22 U/L (ref 15–41)
Albumin: 3.9 g/dL (ref 3.5–5.0)
Alkaline Phosphatase: 96 U/L (ref 38–126)
Anion gap: 4 — ABNORMAL LOW (ref 5–15)
BILIRUBIN TOTAL: 1 mg/dL (ref 0.3–1.2)
BUN: 12 mg/dL (ref 6–20)
CO2: 27 mmol/L (ref 22–32)
Calcium: 9.2 mg/dL (ref 8.9–10.3)
Chloride: 107 mmol/L (ref 98–111)
Creatinine, Ser: 1.32 mg/dL — ABNORMAL HIGH (ref 0.61–1.24)
GFR calc non Af Amer: >60 mL/min (ref >60)
Glucose, Bld: 94 mg/dL (ref 70–99)
Potassium: 4.2 mmol/L (ref 3.5–5.1)
Sodium: 138 mmol/L (ref 135–145)
TOTAL PROTEIN: 7.3 g/dL (ref 6.5–8.1)

## 2018-03-23 LAB — CBC
HCT: 42.9 % (ref 39.0–52.0)
Hemoglobin: 13.7 g/dL (ref 13.0–17.0)
MCH: 29.7 pg (ref 26.0–34.0)
MCHC: 31.9 g/dL (ref 30.0–36.0)
MCV: 92.9 fL (ref 80.0–100.0)
Platelets: 231 10*3/uL (ref 150–400)
RBC: 4.62 MIL/uL (ref 4.22–5.81)
RDW: 13.2 % (ref 11.5–15.5)
WBC: 6.8 10*3/uL (ref 4.0–10.5)
nRBC: 0 % (ref 0.0–0.2)

## 2018-03-23 LAB — PROTIME-INR
INR: 0.95
PROTHROMBIN TIME: 12.6 s (ref 11.4–15.2)

## 2018-03-23 LAB — TROPONIN I
Troponin I: <0.03 ng/mL (ref <0.03)
Troponin I: <0.03 ng/mL (ref <0.03)

## 2018-03-23 LAB — CBG MONITORING, ED: Glucose-Capillary: 93 mg/dL (ref 70–99)

## 2018-03-23 LAB — APTT: aPTT: 31 seconds (ref 24–36)

## 2018-03-23 MED ORDER — IOPAMIDOL (ISOVUE-370) INJECTION 76%
100.0000 mL | Freq: Once | INTRAVENOUS | Status: AC
  Administered 2018-03-23: 100 mL via INTRAVENOUS

## 2018-03-23 NOTE — ED Notes (Signed)
Pt was able to ambulate to restroom without assistance. 

## 2018-03-23 NOTE — ED Triage Notes (Addendum)
Pt states he had a sudden onset of numbness on the L side of his body that started at 13:25 and resolved after 20 minutes. Pt denies any balance problems or speech problems. Pt states he now has chest pain with ShOB. Pt non-labored in triage. BEFAST exam negative no focal neuro symptoms present.

## 2018-03-23 NOTE — Discharge Instructions (Signed)
1.  Use the referral number in your discharge instructions to find a new family doctor.  Make an appointment to be seen as soon as possible. 2.  A referral for a neurologist at [facility] has been placed for you.  Call to schedule an appointment as soon as possible. 3.  Return to the emergency department if you have any recurrence of symptoms or new or concerning symptoms.

## 2018-03-23 NOTE — ED Provider Notes (Signed)
[facility] EMERGENCY DEPARTMENT Provider Note   CSN: [id number] Arrival date & time: 03/23/18  1451     History   Chief Complaint Chief Complaint  Patient presents with  . Numbness  . Chest Pain    HPI Randy James is a 48 y.o. male.  HPI Was driving his car at 010130 and talking on a conference call.  He reports that he had a sudden feeling of numbness in his left leg and left arm.  Shortly after that that was followed by a sensation of a somewhat sharp but aching chest pain in the center slightly to the left.  This then resolved.  Patient reports that the numbness sensation lasted somewhat longer.  He reports that he had called his wife at about 145 to let her know about the symptoms.  He went home to change closed with plans to go to the urgent care to get checked.  He reports that at home he went to wash his hands and put toothpaste all over his hands and was concerned at that point of confusion.  He reports he also noted that his speech seems slightly slurred.  At this time, he reports that the symptoms are resolved but does note that he has left leg just feels a little different to him than normal.  He is not having any chest pain at this time.  Patient did not have any headache or visual changes associated with this episode.  He has never had similar episode.  Patient previously had been treated for hypertension but has been off medications for a while due to his physician retiring.  Reports he has been well recently he has not felt sick he has not had fevers chills or respiratory symptoms.  He does not have migraines or regular headaches.  Does have a form of muscular dystrophy but reports he does not have much problem with it.  He reports mostly he just cannot do any aggressive exercise. Past Medical History:  Diagnosis Date  . History of epididymitis   . Hypertension   . Limb-girdle muscular dystrophy (HCC)   . Rhabdomyolysis    history of with transient renal  insufficency    Patient Active Problem List   Diagnosis Date Noted  . 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 Elevated blood pressure 11/30/2010  . Vitamin D deficiency 11/30/2010  . Hoarseness 11/30/2010  . Annual physical exam 10/24/2010  . SWEATING 12/22/2008  . HEADACHE 12/22/2008  . HIP PAIN, LEFT 08/03/2008  . RHABDOMYOLYSIS 08/03/2008  . HYPERLIPIDEMIA, BORDERLINE 10/15/2007  . ECZEMA 10/15/2007  . CONDYLOMA 10/09/2007  . 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 NONSPECIFIC ABNORMAL RESULTS LIVR FUNCTION STUDY 05/24/2007  . 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 HIP SURGERY    . MUSCLE BIOPSY     EMG, Gentic Testing        Home Medications    Prior to Admission medications   Medication Sig Start Date End Date Taking? Authorizing Provider  ibuprofen (ADVIL,MOTRIN) 600 MG tablet Take 1 tablet (600 mg total) by mouth every 6 (six) hours as needed. 06/01/16   Audry PiliMohr, Tyler, PA-C  LOSARTAN POTASSIUM PO Take by mouth.    [provider]  methocarbamol (ROBAXIN) 500 MG tablet Take 1 tablet (500 mg total) by mouth 2 (two) times daily. 06/01/16   Audry PiliMohr, Tyler, PA-C  UNKNOWN TO PATIENT Another BP med    [provider]  Family History Family History  Problem Relation Age of Onset  . Hypertension Mother   . Cancer Maternal Grandmother   . Diabetes Father   . Diabetes type II Brother        fraternal twin brother  . Rheum arthritis Other   . Hyperlipidemia Other   . Sickle cell anemia Cousin     Social History Social History   Tobacco Use  . Smoking status: Former Games developer  . Smokeless tobacco: Never Used  Substance Use Topics  . Alcohol use: Yes  . Drug use: No     Allergies   Succinylcholine   Review of Systems Review of Systems 10 Systems reviewed and are negative for acute change except as noted in the HPI.   Physical Exam Updated Vital Signs BP 138/89   Pulse 74    Temp 98.2 F (36.8 C) (Oral)   Resp 17   Ht 5\' 10"  (1.778 m)   Wt 112.5 kg   SpO2 100%   BMI 35.58 kg/m   Physical Exam Constitutional:      Appearance: Normal appearance.  HENT:     Head: Normocephalic and atraumatic.     Mouth/Throat:     Mouth: Mucous membranes are moist.  Eyes:     Extraocular Movements: Extraocular movements intact.     Pupils: Pupils are equal, round, and reactive to light.  Neck:     Musculoskeletal: Normal range of motion and neck supple.  Cardiovascular:     Rate and Rhythm: Normal rate and regular rhythm.     Pulses: Normal pulses.     Heart sounds: Normal heart sounds.  Pulmonary:     Effort: Pulmonary effort is normal.     Breath sounds: Normal breath sounds.  Abdominal:     General: There is no distension.     Palpations: Abdomen is soft.     Tenderness: There is no abdominal tenderness. There is no guarding.  Musculoskeletal: Normal range of motion.        General: No swelling, tenderness, deformity or signs of injury.     Right lower leg: No edema.     Left lower leg: No edema.  Skin:    General: Skin is warm and dry.  Neurological:     General: No focal deficit present.     Mental Status: He is alert and oriented to person, place, and time.     Cranial Nerves: No cranial nerve deficit.     Sensory: No sensory deficit.     Motor: No weakness.     Coordination: Coordination normal.     Gait: Gait normal.     Comments: Mental status is clear.  Cognitive function is normal.  Movements are coordinated and symmetric speech is clear.  Psychiatric:        Mood and Affect: Mood normal.      ED Treatments / Results  Labs (all labs ordered are listed, but only abnormal results are displayed) Labs Reviewed  COMPREHENSIVE METABOLIC PANEL - Abnormal; Notable for the following components:      Result Value   Creatinine, Ser 1.32 (*)    Anion gap 4 (*)    All other components within normal limits  PROTIME-INR  APTT  CBC  DIFFERENTIAL    TROPONIN I  TROPONIN I  CBG MONITORING, ED    EKG EKG Interpretation  Date/Time:  Saturday March 23 2018 14:56:37 EST Ventricular Rate:  68 PR Interval:  152 QRS Duration: 112 QT Interval:  396 QTC Calculation: 421 R Axis:   -40 Text Interpretation:  Normal sinus rhythm Left axis deviation Left ventricular hypertrophy Abnormal ECG agree. no acute ischemic appearing changes compared to previous Confirmed by Arby Barrette [id number]) on 03/23/2018 4:11:32 PM   Radiology Mr Brain Wo Contrast  Result Date: 03/23/2018 CLINICAL DATA:  48 y/o M; episode of left-sided numbness. History of hereditary progressive muscular dystrophy. EXAM: MRI HEAD WITHOUT CONTRAST TECHNIQUE: Multiplanar, multiecho pulse sequences of the brain and surrounding structures were obtained without intravenous contrast. COMPARISON:  None. FINDINGS: Brain: No acute infarction, hemorrhage, hydrocephalus, extra-axial collection or mass lesion. Scattered nonspecific T2 FLAIR hyperintensities in subcortical and periventricular white matter. No lesion is present within juxta cortical white matter, corpus callosum, basal ganglia, or posterior fossa. No disorder cortical formation identified. Vascular: Normal flow voids. Skull and upper cervical spine: Normal marrow signal. Sinuses/Orbits: Negative. Other: None. IMPRESSION: 1. No acute intracranial abnormality identified. 2. Nonspecific white matter hyperintensities probably related to history of muscular dystrophy. Electronically Signed   By: Mitzi Hansen M.D.   On: 03/23/2018 18:54   Ct Angio Chest/abd/pel For Dissection W And/or W/wo  Result Date: 03/23/2018 CLINICAL DATA:  Chest/back pain, acute, aortic dissection suspect. Sudden onset of left body numbness, now resolved. EXAM: CT ANGIOGRAPHY CHEST, ABDOMEN AND PELVIS TECHNIQUE: Multidetector CT imaging through the chest, abdomen and pelvis was performed using the standard protocol during bolus administration of  intravenous contrast. Multiplanar reconstructed images and MIPs were obtained and reviewed to evaluate the vascular anatomy. CONTRAST:  ISOVUE-370 IOPAMIDOL (ISOVUE-370) INJECTION 76% COMPARISON:  None. FINDINGS: CTA CHEST FINDINGS Cardiovascular: Normal caliber thoracic aorta without hematoma, dissection, aneurysm or acute aortic syndrome. Slight tortuosity of the descending thoracic aorta. Conventional branching pattern from the aortic arch. No filling defects in the central pulmonary arteries to suggest pulmonary embolus. Heart size is normal. No pericardial effusion. Mediastinum/Nodes: No mediastinal or hilar adenopathy. The esophagus is decompressed. No thyroid nodule. Lungs/Pleura: No consolidation, pulmonary edema, or pleural fluid. The trachea and mainstem bronchi are patent. Musculoskeletal: There are no acute or suspicious osseous abnormalities. Review of the MIP images confirms the above findings. CTA ABDOMEN AND PELVIS FINDINGS VASCULAR Aorta: Normal caliber aorta without aneurysm, dissection, vasculitis or significant stenosis. Celiac: Patent without evidence of aneurysm, dissection, vasculitis or significant stenosis. SMA: Patent without evidence of aneurysm, dissection, vasculitis or significant stenosis. Renals: Both renal arteries are patent without evidence of aneurysm, dissection, vasculitis, fibromuscular dysplasia or significant stenosis. IMA: Patent without evidence of aneurysm, dissection, vasculitis or significant stenosis. Inflow: Patent without evidence of aneurysm, dissection, vasculitis or significant stenosis. Veins: No obvious venous abnormality within the limitations of this arterial phase study. Review of the MIP images confirms the above findings. NON-VASCULAR Hepatobiliary: Borderline hepatic steatosis. No focal abnormality on arterial phase imaging. Small gallstone within physiologically distended gallbladder. No pericholecystic inflammation or biliary dilatation. Pancreas:  No ductal dilatation or inflammation. Spleen: Normal in size. Normal arterial phase enhancement. Adrenals/Urinary Tract: Normal adrenal glands. No hydronephrosis or perinephric edema. Homogeneous renal enhancement. Urinary bladder is physiologically distended without wall thickening, partially obscured by streak artifact from left hip arthroplasty. Stomach/Bowel: Stomach is within normal limits. High-density material in the appendix is suspicious for appendicolith, less likely enteric contrast from prior radiologic exam. No periappendiceal inflammation or abnormal distention. No evidence of bowel wall thickening, distention, or inflammatory changes. Lymphatic: No adenopathy. Reproductive: Prostate gland is obscured by streak artifact from left hip arthroplasty. Other: No ascites. No free air. Musculoskeletal: Left hip arthroplasty.  Degenerative change of the right hip. There are no acute or suspicious osseous abnormalities. Review of the MIP images confirms the above findings. IMPRESSION: 1. No aortic dissection or acute aortic abnormality. 2. No acute abnormality in the chest, abdomen, or pelvis. 3. High-density material in the appendix is suspicious for appendicolith, less likely enteric contrast from prior radiologic exam. No evidence of acute appendicitis. Electronically Signed   By: Narda Rutherford M.D.   On: 03/23/2018 20:06    Procedures Procedures (including critical care time)  Medications Ordered in ED Medications  iopamidol (ISOVUE-370) 76 % injection 100 mL (100 mLs Intravenous Contrast Given 03/23/18 1931)     Initial Impression / Assessment and Plan / ED Course  I have reviewed the triage vital signs and the nursing notes.  Pertinent labs & imaging results that were available during my care of the patient were reviewed by me and considered in my medical decision making (see chart for details).  Clinical Course as of Mar 24 2147  Sat Mar 23, 2018  1720 Consult: Reviewed to Dr. Jerrell Belfast  of neurology recommends MRI   [MP]    Clinical Course User Index [MP] Arby Barrette, MD   Patient presented as outlined above with numbness of the left side.  He had also had several episodes of a sharp anterior chest pain.  Neurologic examination normal without deficit.  Patient was evaluated for possible stroke.  MRI does not show any acute findings.  Also, due to sharp chest pain CT angiogram obtained to rule out dissection.  No abnormal findings identified.  Patient remains normal and intact neurologically.  His mental status is clear.  He has no motor deficits.  He is ambulatory without difficulty.  2 sets of cardiac enzymes are negative for acute MI.  At this time, patient is stable for discharge.  He is counseled on close follow-up.  Ambulatory referral to neurology placed.  Patient is aware of follow-up plan and return precautions have been reviewed.    Final Clinical Impressions(s) / ED Diagnoses   Final diagnoses:  Left sided numbness  Precordial chest pain    ED Discharge Orders         Ordered    Ambulatory referral to Neurology    Comments:  An appointment is requested in approximately: 1-2 weeks   03/23/18 2146           Arby Barrette, MD 03/23/18 2152

## 2018-12-15 ENCOUNTER — Other Ambulatory Visit: Payer: Self-pay

## 2018-12-15 ENCOUNTER — Emergency Department (HOSPITAL_BASED_OUTPATIENT_CLINIC_OR_DEPARTMENT_OTHER)
Admission: EM | Admit: 2018-12-15 | Discharge: 2018-12-15 | Disposition: A | Discharge status: Home / Self Care | Payer: BC Managed Care – PPO | Attending: Emergency Medicine | Admitting: Emergency Medicine

## 2018-12-15 ENCOUNTER — Encounter (HOSPITAL_BASED_OUTPATIENT_CLINIC_OR_DEPARTMENT_OTHER): Payer: Self-pay | Admitting: Emergency Medicine

## 2018-12-15 DIAGNOSIS — M7918 Myalgia, other site: Secondary | ICD-10-CM | POA: Diagnosis not present

## 2018-12-15 DIAGNOSIS — Z87891 Personal history of nicotine dependence: Secondary | ICD-10-CM | POA: Diagnosis not present

## 2018-12-15 DIAGNOSIS — I10 Essential (primary) hypertension: Secondary | ICD-10-CM | POA: Diagnosis not present

## 2018-12-15 DIAGNOSIS — Z79899 Other long term (current) drug therapy: Secondary | ICD-10-CM | POA: Diagnosis not present

## 2018-12-15 DIAGNOSIS — U071 COVID-19: Secondary | ICD-10-CM | POA: Diagnosis not present

## 2018-12-15 DIAGNOSIS — R509 Fever, unspecified: Secondary | ICD-10-CM | POA: Diagnosis present

## 2018-12-15 LAB — SARS CORONAVIRUS 2 (TAT 6-24 HRS): SARS Coronavirus 2: POSITIVE — AB

## 2018-12-15 NOTE — ED Provider Notes (Signed)
[facility] EMERGENCY DEPARTMENT Provider Note   CSN: [id number] Arrival date & time: 12/15/18  0109     History   Chief Complaint Chief Complaint  Patient presents with  . Fever    HPI Randy James is a 48 y.o. male.     Patient is a 48 year old male with past medical history of hypertension.  He presents today for evaluation of fever and body aches.  This started yesterday.  He checked his temperature at home and it was 101 this morning.  He denies any specific complaints such as cough, sore throat, difficulty breathing.  He denies any vomiting or diarrhea.  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  He is concerned about COVID-19 and would like to be tested for this.  The history is provided by the patient.  Fever Max temp prior to arrival:  101 Temp source:  Oral Severity:  Moderate Onset quality:  Sudden Duration:  24 hours Timing:  Constant Progression:  Unchanged Relieved by:  Nothing Worsened by:  Nothing Ineffective treatments:  None tried Associated symptoms: myalgias     Past Medical History:  Diagnosis Date  . History of epididymitis   . Hypertension   . Limb-girdle muscular dystrophy (HCC)   . Rhabdomyolysis    history of with transient renal insufficency    Patient Active Problem List   Diagnosis Date Noted  . 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 Elevated blood pressure 11/30/2010  . Vitamin D deficiency 11/30/2010  . Hoarseness 11/30/2010  . Annual physical exam 10/24/2010  . SWEATING 12/22/2008  . HEADACHE 12/22/2008  . HIP PAIN, LEFT 08/03/2008  . RHABDOMYOLYSIS 08/03/2008  . HYPERLIPIDEMIA, BORDERLINE 10/15/2007  . ECZEMA 10/15/2007  . CONDYLOMA 10/09/2007  . 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 NONSPECIFIC ABNORMAL RESULTS LIVR FUNCTION STUDY 05/24/2007  . 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 HIP SURGERY    . MUSCLE BIOPSY     EMG, Gentic Testing        Home Medications    Prior to Admission medications   Medication Sig Start Date End Date Taking? Authorizing Provider  ibuprofen (ADVIL,MOTRIN) 600 MG tablet Take 1 tablet (600 mg total) by mouth every 6 (six) hours as needed. 06/01/16   [name], PA-C  LOSARTAN POTASSIUM PO Take by mouth.    [provider]  methocarbamol (ROBAXIN) 500 MG tablet Take 1 tablet (500 mg total) by mouth 2 (two) times daily. 06/01/16   [name], PA-C  UNKNOWN TO PATIENT Another BP med    [provider]    Family History Family History  Problem Relation Age of Onset  . Hypertension Mother   . Cancer Maternal Grandmother   . Diabetes Father   . Diabetes type II Brother        fraternal twin brother  . Rheum arthritis Other   . Hyperlipidemia Other   . Sickle cell anemia Cousin     Social History Social History   Tobacco Use  . Smoking status: Former Games developer  . Smokeless tobacco: Never Used  Substance Use Topics  . Alcohol use: Yes  . Drug use: No     Allergies   Succinylcholine   Review of Systems Review of Systems  Constitutional: Positive for fever.  Musculoskeletal: Positive for myalgias.  All other systems reviewed and  are negative.    Physical Exam Updated Vital Signs BP 136/78 (BP Location: Right Arm)   Pulse 96   Temp 99.1 F (37.3 C) (Oral)   Resp 18   Ht 5\' 10"  (1.778 m)   Wt 122.5 kg   SpO2 99%   BMI 38.74 kg/m   Physical Exam Vitals signs and nursing note reviewed.  Constitutional:      General: He is not in acute distress.    Appearance: He is well-developed. He is not diaphoretic.  HENT:     Head: Normocephalic and atraumatic.  Neck:     Musculoskeletal: Normal range of motion and neck supple.  Cardiovascular:     Rate and Rhythm: Normal rate and regular rhythm.     Heart sounds: No murmur. No friction rub.  Pulmonary:      Effort: Pulmonary effort is normal. No respiratory distress.     Breath sounds: Normal breath sounds. No wheezing or rales.  Abdominal:     General: Bowel sounds are normal. There is no distension.     Palpations: Abdomen is soft.     Tenderness: There is no abdominal tenderness.  Musculoskeletal: Normal range of motion.  Skin:    General: Skin is warm and dry.  Neurological:     Mental Status: He is alert and oriented to person, place, and time.     Coordination: Coordination normal.      ED Treatments / Results  Labs (all labs ordered are listed, but only abnormal results are displayed) Labs Reviewed  SARS CORONAVIRUS 2 (TAT 6-24 HRS)    EKG None  Radiology No results found.  Procedures Procedures (including critical care time)  Medications Ordered in ED Medications - No data to display   Initial Impression / Assessment and Plan / ED Course  I have reviewed the triage vital signs and the nursing notes.  Pertinent labs & imaging results that were available during my care of the patient were reviewed by me and considered in my medical decision making (see chart for details).  Patient presents here with fever and body aches since yesterday.  He is concerned about COVID-19.  His physical examination and vital signs are all unremarkable.  Temperature in the ER is 99.1, however it was 101 at home prior to coming here.  A send out test will be obtained.  Patient to quarantine in the meantime, drink plenty of fluids, take Tylenol and Motrin as needed, and return if symptoms worsen.  Final Clinical Impressions(s) / ED Diagnoses   Final diagnoses:  None    ED Discharge Orders    None       Veryl Speak, MD 12/15/18 1000

## 2018-12-15 NOTE — Discharge Instructions (Addendum)
Drink plenty of fluids and get plenty of rest.  Take Tylenol 1000 mg rotated with ibuprofen 600 mg every 4 hours as needed for pain or fever.  Quarantine at home until results of the COVID-19 test are known.  Return to the emergency department if you develop chest pain, difficulty breathing, or other new and concerning symptoms.        Infection Prevention Recommendations for Individuals Confirmed to have, or Being Evaluated for, 2019 Novel Coronavirus (COVID-19) Infection Who Receive Care at Home  Individuals who are confirmed to have, or are being evaluated for, COVID-19 should follow the prevention steps below until a healthcare provider or local or state health department says they can return to normal activities.  Stay home except to get medical care You should restrict activities outside your home, except for getting medical care. Do not go to work, school, or public areas, and do not use public transportation or taxis.  Call ahead before visiting your doctor Before your medical appointment, call the healthcare provider and tell them that you have, or are being evaluated for, COVID-19 infection. This will help the healthcare providers office take steps to keep other people from getting infected. Ask your healthcare provider to call the local or state health department.  Monitor your symptoms Seek prompt medical attention if your illness is worsening (e.g., difficulty breathing). Before going to your medical appointment, call the healthcare provider and tell them that you have, or are being evaluated for, COVID-19 infection. Ask your healthcare provider to call the local or state health department.  Wear a facemask You should wear a facemask that covers your nose and mouth when you are in the same room with other people and when you visit a healthcare provider. People who live with or visit you should also wear a facemask while they are in the same room with you.  Separate  yourself from other people in your home As much as possible, you should stay in a different room from other people in your home. Also, you should use a separate bathroom, if available.  Avoid sharing household items You should not share dishes, drinking glasses, cups, eating utensils, towels, bedding, or other items with other people in your home. After using these items, you should wash them thoroughly with soap and water.  Cover your coughs and sneezes Cover your mouth and nose with a tissue when you cough or sneeze, or you can cough or sneeze into your sleeve. Throw used tissues in a lined trash can, and immediately wash your hands with soap and water for at least 20 seconds or use an alcohol-based hand rub.  Wash your Union Pacific Corporationhands Wash your hands often and thoroughly with soap and water for at least 20 seconds. You can use an alcohol-based hand sanitizer if soap and water are not available and if your hands are not visibly dirty. Avoid touching your eyes, nose, and mouth with unwashed hands.   Prevention Steps for Caregivers and Household Members of Individuals Confirmed to have, or Being Evaluated for, COVID-19 Infection Being Cared for in the Home  If you live with, or provide care at home for, a person confirmed to have, or being evaluated for, COVID-19 infection please follow these guidelines to prevent infection:  Follow healthcare providers instructions Make sure that you understand and can help the patient follow any healthcare provider instructions for all care.  Provide for the patients basic needs You should help the patient with basic needs in the home and  provide support for getting groceries, prescriptions, and other personal needs.  Monitor the patients symptoms If they are getting sicker, call his or her medical provider and tell them that the patient has, or is being evaluated for, COVID-19 infection. This will help the healthcare providers office take steps to keep  other people from getting infected. Ask the healthcare provider to call the local or state health department.  Limit the number of people who have contact with the patient If possible, have only one caregiver for the patient. Other household members should stay in another home or place of residence. If this is not possible, they should stay in another room, or be separated from the patient as much as possible. Use a separate bathroom, if available. Restrict visitors who do not have an essential need to be in the home.  Keep older adults, very young children, and other sick people away from the patient Keep older adults, very young children, and those who have compromised immune systems or chronic health conditions away from the patient. This includes people with chronic heart, lung, or kidney conditions, diabetes, and cancer.  Ensure good ventilation Make sure that shared spaces in the home have good air flow, such as from an air conditioner or an opened window, weather permitting.  Wash your hands often Wash your hands often and thoroughly with soap and water for at least 20 seconds. You can use an alcohol based hand sanitizer if soap and water are not available and if your hands are not visibly dirty. Avoid touching your eyes, nose, and mouth with unwashed hands. Use disposable paper towels to dry your hands. If not available, use dedicated cloth towels and replace them when they become wet.  Wear a facemask and gloves Wear a disposable facemask at all times in the room and gloves when you touch or have contact with the patients blood, body fluids, and/or secretions or excretions, such as sweat, saliva, sputum, nasal mucus, vomit, urine, or feces.  Ensure the mask fits over your nose and mouth tightly, and do not touch it during use. Throw out disposable facemasks and gloves after using them. Do not reuse. Wash your hands immediately after removing your facemask and gloves. If your  personal clothing becomes contaminated, carefully remove clothing and launder. Wash your hands after handling contaminated clothing. Place all used disposable facemasks, gloves, and other waste in a lined container before disposing them with other household waste. Remove gloves and wash your hands immediately after handling these items.  Do not share dishes, glasses, or other household items with the patient Avoid sharing household items. You should not share dishes, drinking glasses, cups, eating utensils, towels, bedding, or other items with a patient who is confirmed to have, or being evaluated for, COVID-19 infection. After the person uses these items, you should wash them thoroughly with soap and water.  Wash laundry thoroughly Immediately remove and wash clothes or bedding that have blood, body fluids, and/or secretions or excretions, such as sweat, saliva, sputum, nasal mucus, vomit, urine, or feces, on them. Wear gloves when handling laundry from the patient. Read and follow directions on labels of laundry or clothing items and detergent. In general, wash and dry with the warmest temperatures recommended on the label.  Clean all areas the individual has used often Clean all touchable surfaces, such as counters, tabletops, doorknobs, bathroom fixtures, toilets, phones, keyboards, tablets, and bedside tables, every day. Also, clean any surfaces that may have blood, body fluids, and/or secretions or  excretions on them. Wear gloves when cleaning surfaces the patient has come in contact with. Use a diluted bleach solution (e.g., dilute bleach with 1 part bleach and 10 parts water) or a household disinfectant with a label that says EPA-registered for coronaviruses. To make a bleach solution at home, add 1 tablespoon of bleach to 1 quart (4 cups) of water. For a larger supply, add  cup of bleach to 1 gallon (16 cups) of water. Read labels of cleaning products and follow recommendations provided on  product labels. Labels contain instructions for safe and effective use of the cleaning product including precautions you should take when applying the product, such as wearing gloves or eye protection and making sure you have good ventilation during use of the product. Remove gloves and wash hands immediately after cleaning.  Monitor yourself for signs and symptoms of illness Caregivers and household members are considered close contacts, should monitor their health, and will be asked to limit movement outside of the home to the extent possible. Follow the monitoring steps for close contacts listed on the symptom monitoring form.   ? If you have additional questions, contact your local health department or call the epidemiologist on call at [phone] (available 24/7). ? This guidance is subject to change. For the most up-to-date guidance from [facility], please refer to their website: [email or web address]

## 2018-12-15 NOTE — ED Triage Notes (Signed)
Fever and body aches since yesterday. 101 this morning. Did not take any meds.

## 2018-12-16 ENCOUNTER — Telehealth: Payer: Self-pay | Admitting: *Deleted

## 2018-12-16 NOTE — Telephone Encounter (Addendum)
Pt called to discuss results of COVID test; explained to to that he tested positive; he has been taking Tylenol for his fever; instructions given to pt: Marland Kitchen Instruct the patient to remain in self-quarantine until they meet the "Non-Test Criteria for Ending Self-Isolation". Non-Test Criteria for Ending Self-Isolation All persons with fever and respiratory symptoms should isolate themselves until ALL conditions listed below are met: - at least 10 days since symptoms onset - AND 3 consecutive days fever free without antipyretics (acetaminophen [Tylenol] or ibuprofen [Advil]) - AND improvement in respiratory symptoms . If the patient develops respiratory issues/distress, seek medical care in the Emergency Department, call 911, reports symptoms and report COVID-19 positive test. Patient Instructions .  continue to utilize over-the-counter medications for fever (ibuprofen and/or Tylenol) and cough (cough medicine and/or sore throat lozenges). .  wear a mask around people and follow good infection prevention techniques. Marland Kitchen  only leave home to seek medical care. . send family for food, prescriptions or medicines; or use delivery service.  . If the patient must leave the home, they must wear a mask in public. Marland Kitchen limit contact with immediate family members or caregivers in the home, and use mask, social distancing, and handwashing to decrease risk to patients. o Please continue good preventive care measures, including frequent hand washing, avoid touching your face, cover coughs/sneezes with tissue or into elbow, stay out of crowds and keep a 6-foot distance from others.   . patient and family to clean hard surfaces touched by patient frequently with household cleaning products; pt notified the [facility] Dept will be notified, pt also advised to contact his PCP; he verbalized understanding; will notify [facility]; unable to chart in result note because no encounter created.

## 2018-12-22 ENCOUNTER — Emergency Department (HOSPITAL_BASED_OUTPATIENT_CLINIC_OR_DEPARTMENT_OTHER): Payer: BC Managed Care – PPO

## 2018-12-22 ENCOUNTER — Encounter (HOSPITAL_BASED_OUTPATIENT_CLINIC_OR_DEPARTMENT_OTHER): Payer: Self-pay | Admitting: Emergency Medicine

## 2018-12-22 ENCOUNTER — Emergency Department (HOSPITAL_BASED_OUTPATIENT_CLINIC_OR_DEPARTMENT_OTHER)
Admission: EM | Admit: 2018-12-22 | Discharge: 2018-12-22 | Disposition: A | Discharge status: Home / Self Care | Payer: BC Managed Care – PPO | Attending: Emergency Medicine | Admitting: Emergency Medicine

## 2018-12-22 ENCOUNTER — Other Ambulatory Visit: Payer: Self-pay

## 2018-12-22 DIAGNOSIS — U071 COVID-19: Secondary | ICD-10-CM | POA: Insufficient documentation

## 2018-12-22 DIAGNOSIS — J1289 Other viral pneumonia: Secondary | ICD-10-CM | POA: Insufficient documentation

## 2018-12-22 DIAGNOSIS — Z87891 Personal history of nicotine dependence: Secondary | ICD-10-CM | POA: Diagnosis not present

## 2018-12-22 DIAGNOSIS — Z79899 Other long term (current) drug therapy: Secondary | ICD-10-CM | POA: Diagnosis not present

## 2018-12-22 DIAGNOSIS — J1282 Pneumonia due to coronavirus disease 2019: Secondary | ICD-10-CM

## 2018-12-22 DIAGNOSIS — R0602 Shortness of breath: Secondary | ICD-10-CM | POA: Diagnosis present

## 2018-12-22 MED ORDER — SODIUM CHLORIDE 0.9 % IV BOLUS
1000.0000 mL | Freq: Once | INTRAVENOUS | Status: AC
  Administered 2018-12-22: 1000 mL via INTRAVENOUS

## 2018-12-22 MED ORDER — ALBUTEROL SULFATE HFA 108 (90 BASE) MCG/ACT IN AERS
2.0000 | INHALATION_SPRAY | RESPIRATORY_TRACT | Status: DC | PRN
  Filled 2018-12-22: qty 6.7

## 2018-12-22 MED ORDER — ACETAMINOPHEN 500 MG PO TABS
1000.0000 mg | ORAL_TABLET | Freq: Once | ORAL | Status: AC
  Administered 2018-12-22: 1000 mg via ORAL
  Filled 2018-12-22: qty 2

## 2018-12-22 NOTE — ED Provider Notes (Signed)
[facility] EMERGENCY DEPARTMENT Provider Note   CSN: [id number] Arrival date & time: 12/22/18  1120     History   Chief Complaint Chief Complaint  Patient presents with  . Shortness of Breath    HPI Randy James is a 48 y.o. male.     The history is provided by the patient and medical records. No language interpreter was used.  Shortness of Breath Associated symptoms: fever   Associated symptoms: no abdominal pain, no chest pain, no cough and no vomiting    Randy James is a 48 y.o. male  with a PMH as listed below who presents to the Emergency Department complaining of persistent shortness of breath.  Patient tested positive for coronavirus 7 days ago.  At onset, he was having fever and body aches.  Last night, he started feeling as if his breathing was labored which persisted into this morning, prompting him to seek care.  Took Tylenol last night before bed. No meds this am.   Past Medical History:  Diagnosis Date  . History of epididymitis   . Hypertension   . Limb-girdle muscular dystrophy (HCC)   . Rhabdomyolysis    history of with transient renal insufficency    Patient Active Problem List   Diagnosis Date Noted  . 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 Elevated blood pressure 11/30/2010  . Vitamin D deficiency 11/30/2010  . Hoarseness 11/30/2010  . Annual physical exam 10/24/2010  . SWEATING 12/22/2008  . HEADACHE 12/22/2008  . HIP PAIN, LEFT 08/03/2008  . RHABDOMYOLYSIS 08/03/2008  . HYPERLIPIDEMIA, BORDERLINE 10/15/2007  . ECZEMA 10/15/2007  . CONDYLOMA 10/09/2007  . 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 NONSPECIFIC ABNORMAL RESULTS LIVR FUNCTION STUDY 05/24/2007  . 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 HIP SURGERY    . MUSCLE BIOPSY     EMG, Gentic Testing        Home Medications    Prior to Admission medications   Medication Sig Start Date End Date  Taking? Authorizing Provider  triamterene-hydrochlorothiazide (DYAZIDE) 37.5-25 MG capsule Take by mouth. 11/06/18  Yes [provider]  ibuprofen (ADVIL,MOTRIN) 600 MG tablet Take 1 tablet (600 mg total) by mouth every 6 (six) hours as needed. 06/01/16   [name], PA-C  LOSARTAN POTASSIUM PO Take by mouth.    [provider]  methocarbamol (ROBAXIN) 500 MG tablet Take 1 tablet (500 mg total) by mouth 2 (two) times daily. 06/01/16   [name], PA-C  UNKNOWN TO PATIENT Another BP med    [provider]    Family History Family History  Problem Relation Age of Onset  . Hypertension Mother   . Cancer Maternal Grandmother   . Diabetes Father   . Diabetes type II Brother        fraternal twin brother  . Rheum arthritis Other   . Hyperlipidemia Other   . Sickle cell anemia Cousin     Social History Social History   Tobacco Use  . Smoking status: Former Games developer  . Smokeless tobacco: Never Used  Substance Use Topics  . Alcohol use: Yes  . Drug use: No     Allergies   Succinylcholine   Review of Systems Review of Systems  Constitutional: Positive for fever.  Respiratory: Positive for shortness of breath. Negative for cough.   Cardiovascular: Negative for chest pain.  Gastrointestinal: Positive for diarrhea. Negative for abdominal pain, nausea and vomiting.  Musculoskeletal:  Positive for myalgias.  All other systems reviewed and are negative.    Physical Exam Updated Vital Signs BP 130/80 (BP Location: Right Arm)   Pulse 90   Temp (!) 101.8 F (38.8 C) (Oral)   Resp 20   Ht 5\' 10"  (1.778 m)   Wt 122.5 kg   SpO2 96%   BMI 38.74 kg/m   Physical Exam Vitals signs and nursing note reviewed.  Constitutional:      General: He is not in acute distress.    Appearance: He is well-developed.  HENT:     Head: Normocephalic and atraumatic.  Neck:     Musculoskeletal: Neck supple.  Cardiovascular:     Rate and Rhythm: Normal rate and regular  rhythm.     Heart sounds: Normal heart sounds. No murmur.  Pulmonary:     Effort: Pulmonary effort is normal. No respiratory distress.     Breath sounds: Normal breath sounds.     Comments: Lungs are clear to auscultation bilaterally.  Effort normal.  Speaking in full sentences without any difficulty.  Oxygenating 97% on room air. Abdominal:     General: There is no distension.     Palpations: Abdomen is soft.     Comments: No abdominal tenderness.  Skin:    General: Skin is warm and dry.  Neurological:     Mental Status: He is alert and oriented to person, place, and time.      ED Treatments / Results  Labs (all labs ordered are listed, but only abnormal results are displayed) Labs Reviewed - No data to display  EKG None  Radiology Dg Chest Portable 1 View  Result Date: 12/22/2018 CLINICAL DATA:  Shortness of breath, fever, COVID-19 EXAM: PORTABLE CHEST 1 VIEW COMPARISON:  08/08/2018 FINDINGS: The heart size and mediastinal contours are within normal limits. There is subtle bilateral heterogeneous pulmonary opacity, most conspicuous in the peripheral right midlung. The visualized skeletal structures are unremarkable. IMPRESSION: There is subtle bilateral heterogeneous pulmonary opacity, most conspicuous in the peripheral right midlung and in keeping with reported COVID-19. Electronically Signed   By: Eddie Candle M.D.   On: 12/22/2018 12:46    Procedures Procedures (including critical care time)  Medications Ordered in ED Medications  albuterol (VENTOLIN HFA) 108 (90 Base) MCG/ACT inhaler 2 puff (has no administration in time range)  sodium chloride 0.9 % bolus 1,000 mL (1,000 mLs Intravenous New Bag/Given 12/22/18 1232)  acetaminophen (TYLENOL) tablet 1,000 mg (1,000 mg Oral Given 12/22/18 1229)     Initial Impression / Assessment and Plan / ED Course  I have reviewed the triage vital signs and the nursing notes.  Pertinent labs & imaging results that were available  during my care of the patient were reviewed by me and considered in my medical decision making (see chart for details).       Randy James is a 48 y.o. male with known diagnosis of coronavirus over the last week who presents with new shortness of breath which began yesterday.  On exam, patient does have a temperature of 101.8.  He has not had any antipyretics today.  Given Tylenol.  Otherwise, vitals are very reassuring.  Oxygenation saturation of 97% on room air.  He is speaking in full sentences without difficulty and does not appear in any acute distress.  Chest x-ray does show subtle bilateral opacities consistent with coronavirus.  Provided with albuterol inhaler. Evaluation does not show pathology that would require ongoing emergent intervention or inpatient treatment.  Discussed disease progression, home care instructions as well as importance of returning to ER should his respiratory symptoms worsen.  All questions answered.    Final Clinical Impressions(s) / ED Diagnoses   Final diagnoses:  Pneumonia due to COVID-19 virus    ED Discharge Orders    None       Zi Newbury, Chase PicketJaime Pilcher, PA-C 12/22/18 1320    Tilden Fossaees, Elizabeth, MD 12/24/18 [phone]

## 2018-12-22 NOTE — Discharge Instructions (Signed)
It was my pleasure taking care of you today!   Tylenol 1000mg  every 6-8 hours as needed for fever.  2-4 puffs of inhaler every 4-6 hours as needed for shortness of breath.   Call your primary care doctor to update them about today's findings. They may want to do a virtual visit, but may not.   Return to ER for difficulty breathing, new or worsening symptoms, any additional concerns.

## 2018-12-22 NOTE — ED Triage Notes (Signed)
SOB since last night with fever, diagnosed with COVID last week. No meds taken today.

## 2018-12-22 NOTE — ED Notes (Signed)
Bedside monitor on auto. Pt provided urinal, remote, phone at bedside.

## 2020-06-07 IMAGING — DX DG CHEST 1V PORT
1 series · 1 of 1 positions shown · non-contrast
Comparison: 08/08/2018

CLINICAL DATA: Shortness of breath, fever, KQSOO-ON

EXAM:
PORTABLE CHEST 1 VIEW

[chest ap]
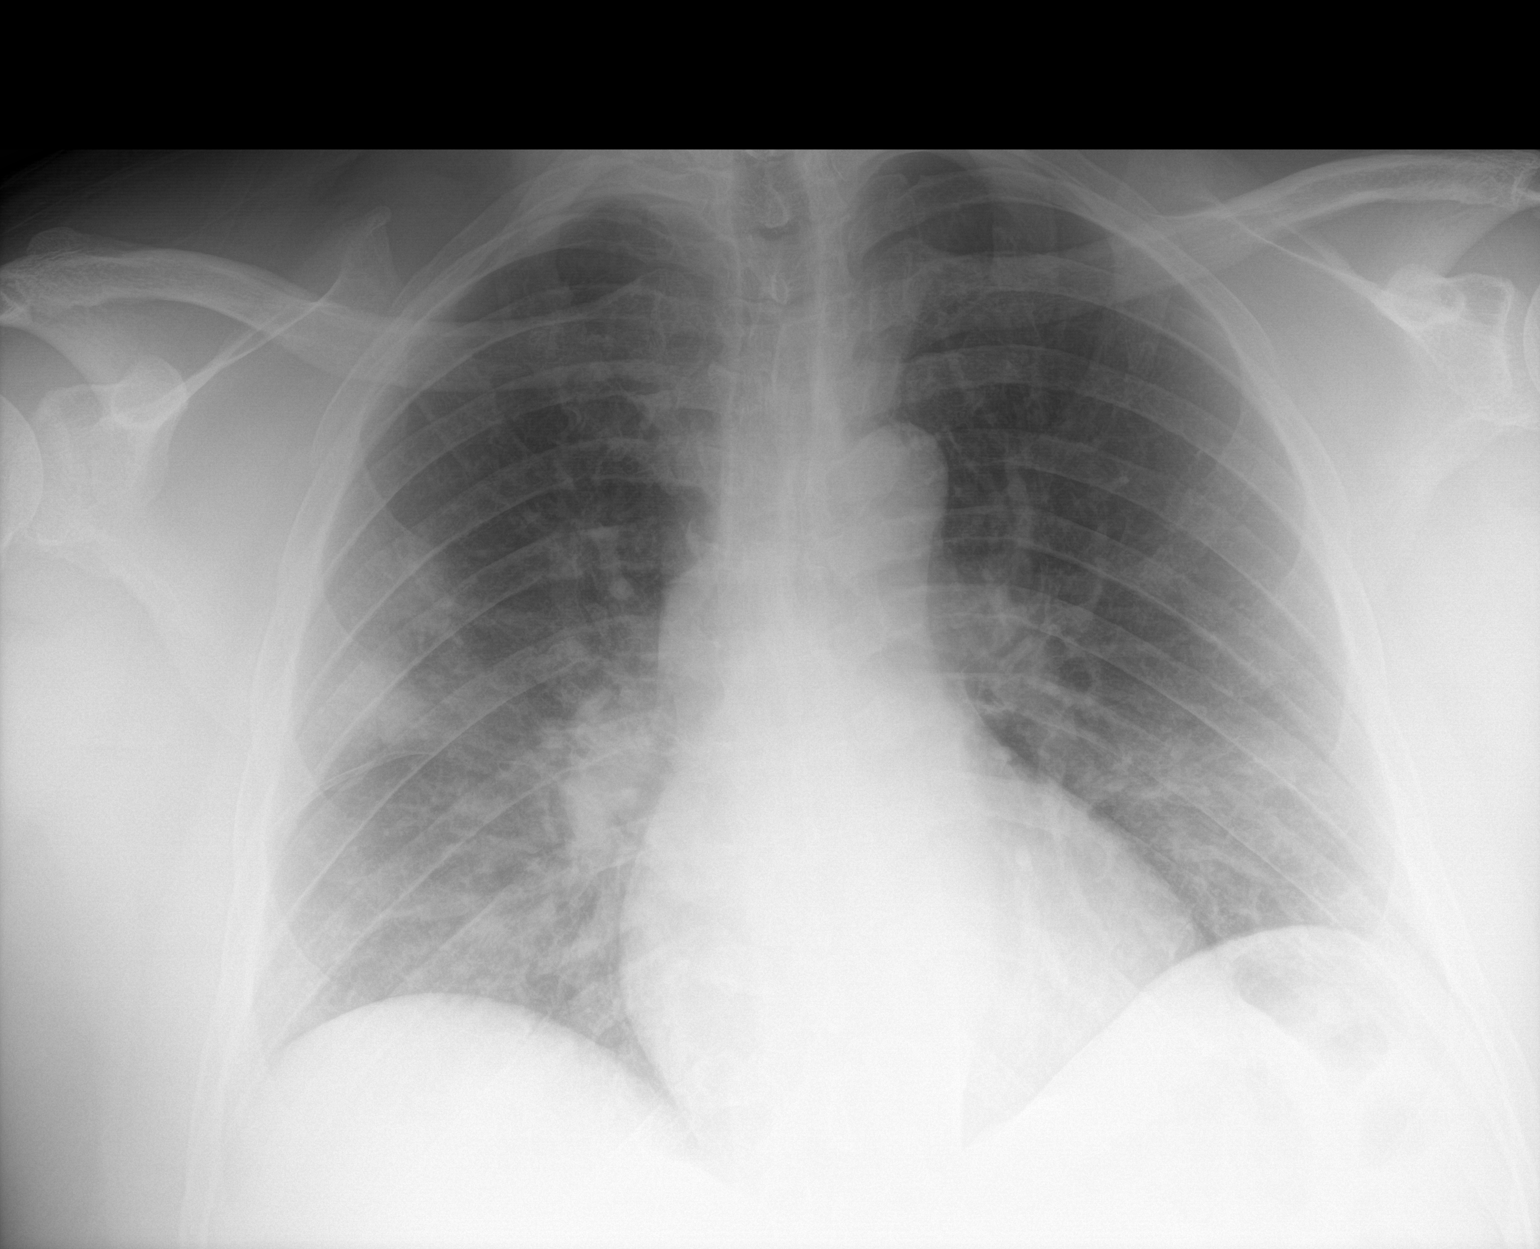

[1 of 1 positions shown; findings below may reference images not displayed]

FINDINGS: The heart size and mediastinal contours are within normal limits.
There is subtle bilateral heterogeneous pulmonary opacity, most
conspicuous in the peripheral right midlung. The visualized skeletal
structures are unremarkable.
IMPRESSION: There is subtle bilateral heterogeneous pulmonary opacity, most
conspicuous in the peripheral right midlung and in keeping with
reported KQSOO-ON.

## 2023-03-27 ENCOUNTER — Emergency Department (HOSPITAL_BASED_OUTPATIENT_CLINIC_OR_DEPARTMENT_OTHER)
Admission: EM | Admit: 2023-03-27 | Discharge: 2023-03-27 | Disposition: A | Discharge status: Home / Self Care | Payer: 59 | Attending: Emergency Medicine | Admitting: Emergency Medicine

## 2023-03-27 ENCOUNTER — Other Ambulatory Visit: Payer: Self-pay

## 2023-03-27 ENCOUNTER — Encounter (HOSPITAL_BASED_OUTPATIENT_CLINIC_OR_DEPARTMENT_OTHER): Payer: Self-pay | Admitting: Emergency Medicine

## 2023-03-27 DIAGNOSIS — R599 Enlarged lymph nodes, unspecified: Secondary | ICD-10-CM

## 2023-03-27 DIAGNOSIS — L739 Follicular disorder, unspecified: Secondary | ICD-10-CM | POA: Insufficient documentation

## 2023-03-27 DIAGNOSIS — R590 Localized enlarged lymph nodes: Secondary | ICD-10-CM | POA: Diagnosis present

## 2023-03-27 LAB — COMPREHENSIVE METABOLIC PANEL
ALT: 25 U/L (ref 0–44)
AST: 20 U/L (ref 15–41)
Albumin: 3.9 g/dL (ref 3.5–5.0)
Alkaline Phosphatase: 95 U/L (ref 38–126)
Anion gap: 9 (ref 5–15)
BUN: 21 mg/dL — ABNORMAL HIGH (ref 6–20)
CO2: 24 mmol/L (ref 22–32)
Calcium: 8.9 mg/dL (ref 8.9–10.3)
Chloride: 104 mmol/L (ref 98–111)
Creatinine, Ser: 1.63 mg/dL — ABNORMAL HIGH (ref 0.61–1.24)
GFR, Estimated: 50 mL/min — ABNORMAL LOW (ref >60)
Glucose, Bld: 106 mg/dL — ABNORMAL HIGH (ref 70–99)
Potassium: 3.6 mmol/L (ref 3.5–5.1)
Sodium: 137 mmol/L (ref 135–145)
Total Bilirubin: 0.7 mg/dL (ref 0.0–1.2)
Total Protein: 7.4 g/dL (ref 6.5–8.1)

## 2023-03-27 LAB — CBC WITH DIFFERENTIAL/PLATELET
Abs Immature Granulocytes: 0.02 10*3/uL (ref 0.00–0.07)
Basophils Absolute: 0 10*3/uL (ref 0.0–0.1)
Basophils Relative: 1 %
Eosinophils Absolute: 0.2 10*3/uL (ref 0.0–0.5)
Eosinophils Relative: 3 %
HCT: 41.1 % (ref 39.0–52.0)
Hemoglobin: 14 g/dL (ref 13.0–17.0)
Immature Granulocytes: 0 %
Lymphocytes Relative: 35 %
Lymphs Abs: 2.3 10*3/uL (ref 0.7–4.0)
MCH: 30.4 pg (ref 26.0–34.0)
MCHC: 34.1 g/dL (ref 30.0–36.0)
MCV: 89.2 fL (ref 80.0–100.0)
Monocytes Absolute: 0.6 10*3/uL (ref 0.1–1.0)
Monocytes Relative: 9 %
Neutro Abs: 3.5 10*3/uL (ref 1.7–7.7)
Neutrophils Relative %: 52 %
Platelets: 268 10*3/uL (ref 150–400)
RBC: 4.61 MIL/uL (ref 4.22–5.81)
RDW: 12.9 % (ref 11.5–15.5)
WBC: 6.7 10*3/uL (ref 4.0–10.5)
nRBC: 0 % (ref 0.0–0.2)

## 2023-03-27 MED ORDER — DOXYCYCLINE HYCLATE 100 MG PO TABS
100.0000 mg | ORAL_TABLET | Freq: Once | ORAL | Status: AC
  Administered 2023-03-27: 100 mg via ORAL
  Filled 2023-03-27: qty 1

## 2023-03-27 MED ORDER — DOXYCYCLINE HYCLATE 100 MG PO CAPS: 100.0000 mg | ORAL_CAPSULE | Freq: Two times a day (BID) | ORAL | 0 refills | Status: AC

## 2023-03-27 NOTE — Discharge Instructions (Signed)
 Take antibiotics as prescribed.  Recommend bacitracin or Neosporin twice daily to the back your ear as we discussed.  Follow-up with your primary care doctor.  Return if symptoms worsen.

## 2023-03-27 NOTE — ED Notes (Signed)
 Reviewed D/C information with the patient, pt verbalized understanding. No additional concerns at this time.

## 2023-03-27 NOTE — ED Provider Notes (Signed)
 [facility] Provider Note   CSN: [id number] Arrival date & time: 03/27/23  2108     History  Chief Complaint  Patient presents with   Cyst    Randy James is a 53 y.o. male.  Patient here with a little bit of swelling behind his right ear with a swollen lymph node nearby.  History of hypertension.  Denies any fever or chills.  Nothing makes it worse or better.  Noticed that here the last day or so.  Denies any headache vision loss ear pain throat pain.  The history is provided by the patient.       Home Medications Prior to Admission medications   Medication Sig Start Date End Date Taking? Authorizing Provider  doxycycline  (VIBRAMYCIN ) 100 MG capsule Take 1 capsule (100 mg total) by mouth 2 (two) times daily. 03/27/23  Yes Jareli Highland, DO  ibuprofen  (ADVIL ,MOTRIN ) 600 MG tablet Take 1 tablet (600 mg total) by mouth every 6 (six) hours as needed. 06/01/16   Olympia Gee, PA-C  LOSARTAN POTASSIUM PO Take by mouth.    [provider]  methocarbamol  (ROBAXIN ) 500 MG tablet Take 1 tablet (500 mg total) by mouth 2 (two) times daily. 06/01/16   Olympia Gee, PA-C  triamterene-hydrochlorothiazide (DYAZIDE) 37.5-25 MG capsule Take by mouth. 11/06/18   [provider]  UNKNOWN TO PATIENT Another BP med    [provider]      Allergies    Succinylcholine    Review of Systems   Review of Systems  Physical Exam Updated Vital Signs BP (!) 150/90 (BP Location: Left Arm)   Pulse 80   Temp 98 F (36.7 C) (Oral)   Resp 18   Ht 5' 10.5 (1.791 m)   Wt 125.2 kg   SpO2 100%   BMI 39.04 kg/m  Physical Exam Vitals and nursing note reviewed.  Constitutional:      General: He is not in acute distress.    Appearance: He is well-developed.  HENT:     Head: Normocephalic and atraumatic.     Comments: No trismus drooling or submandibular swelling or facial swelling Eyes:     Conjunctiva/sclera: Conjunctivae  normal.  Cardiovascular:     Rate and Rhythm: Normal rate and regular rhythm.     Pulses: Normal pulses.     Heart sounds: Normal heart sounds. No murmur heard. Pulmonary:     Effort: Pulmonary effort is normal. No respiratory distress.     Breath sounds: Normal breath sounds.  Abdominal:     Palpations: Abdomen is soft.     Tenderness: There is no abdominal tenderness.  Musculoskeletal:        General: No swelling.     Cervical back: Neck supple.  Skin:    General: Skin is warm and dry.     Capillary Refill: Capillary refill takes less than 2 seconds.     Comments: a small area of irritation right behind the right earlobe that looks like a folliculitis, he has palpable lymph node just below this in the curricular area  Neurological:     Mental Status: He is alert.  Psychiatric:        Mood and Affect: Mood normal.     ED Results / Procedures / Treatments   Labs (all labs ordered are listed, but only abnormal results are displayed) Labs Reviewed  COMPREHENSIVE METABOLIC PANEL - Abnormal; Notable for the following components:      Result  Value   Glucose, Bld 106 (*)    BUN 21 (*)    Creatinine, Ser 1.63 (*)    GFR, Estimated 50 (*)    All other components within normal limits  CBC WITH DIFFERENTIAL/PLATELET    EKG None  Radiology No results found.  Procedures Procedures    Medications Ordered in ED Medications  doxycycline  (VIBRA -TABS) tablet 100 mg (has no administration in time range)    ED Course/ Medical Decision Making/ A&P                                 Medical Decision Making Amount and/or Complexity of Data Reviewed Labs: ordered.  Risk Prescription drug management.   Randy James is here with swollen lymph node, little irritation and swelling behind his right ear lobe.  He has history of hypertension.  Overall very well-appearing.  Basic labs were obtained he has no significant leukocytosis or anemia or electrolyte abnormality per my  review interpretation.  Have no concern for lymphoma or chemo at this time.  He has a small palpable folliculitis type area behind the right earlobe with a little bit of reactive lymph node swelling nearby.  There is no trismus or drooling or major submandibular swelling or facial swelling.  I think we can treat this with bacitracin or Neosporin twice daily with doxycycline  and follow-up with primary care doctor.  Discharged in good condition.  Understands return precautions.  This chart was dictated using voice recognition software.  Despite best efforts to proofread,  errors can occur which can change the documentation meaning.         Final Clinical Impression(s) / ED Diagnoses Final diagnoses:  Swollen lymph nodes  Folliculitis    Rx / DC Orders ED Discharge Orders          Ordered    doxycycline  (VIBRAMYCIN ) 100 MG capsule  2 times daily        03/27/23 2241              Ruthe Cornet, DO 03/27/23 2243

## 2023-03-27 NOTE — ED Triage Notes (Signed)
 Pt presents to the ED via POV with complaints of superficaila lesion on the back of his R ear x 2 days. Pt notes being seen by PCP who advised he should be seen by Derm for potential biopsy. Pt has mild edema and endorses pain with palpation. Rates the pain 3/10 - no meds taken PTA. A&Ox4 at this time. Denies  fevers, chills, drainage, CP or SOB.
